# Patient Record
Sex: Female | Born: 1985 | Race: White | Hispanic: No | Marital: Single | Smoking: Light tobacco smoker
Health system: Southern US, Community
[De-identification: ages and names within clinical notes are randomized; demographics above are authoritative.]

## PROBLEM LIST (undated history)

## (undated) ENCOUNTER — Inpatient Hospital Stay (HOSPITAL_COMMUNITY): Payer: Self-pay

## (undated) DIAGNOSIS — R519 Headache, unspecified: Secondary | ICD-10-CM

## (undated) DIAGNOSIS — K623 Rectal prolapse: Secondary | ICD-10-CM

## (undated) DIAGNOSIS — R51 Headache: Secondary | ICD-10-CM

## (undated) DIAGNOSIS — Z8739 Personal history of other diseases of the musculoskeletal system and connective tissue: Secondary | ICD-10-CM

## (undated) HISTORY — PX: CHOLECYSTECTOMY: SHX55

---

## 2005-09-28 ENCOUNTER — Emergency Department (HOSPITAL_COMMUNITY): Admission: EM | Admit: 2005-09-28 | Discharge: 2005-09-28 | Payer: Self-pay | Admitting: Emergency Medicine

## 2014-09-06 ENCOUNTER — Inpatient Hospital Stay (HOSPITAL_COMMUNITY)
Admission: AD | Admit: 2014-09-06 | Discharge: 2014-09-06 | Discharge status: Against Medical Advice | Payer: Medicaid Other | Source: Ambulatory Visit | Attending: Obstetrics and Gynecology | Admitting: Obstetrics and Gynecology

## 2014-09-06 ENCOUNTER — Encounter (HOSPITAL_COMMUNITY): Payer: Self-pay | Admitting: Emergency Medicine

## 2014-09-06 DIAGNOSIS — Z654 Victim of crime and terrorism: Secondary | ICD-10-CM | POA: Diagnosis not present

## 2014-09-06 DIAGNOSIS — R109 Unspecified abdominal pain: Secondary | ICD-10-CM | POA: Insufficient documentation

## 2014-09-06 DIAGNOSIS — F17200 Nicotine dependence, unspecified, uncomplicated: Secondary | ICD-10-CM | POA: Insufficient documentation

## 2014-09-06 DIAGNOSIS — T8339XA Other mechanical complication of intrauterine contraceptive device, initial encounter: Secondary | ICD-10-CM | POA: Insufficient documentation

## 2014-09-06 DIAGNOSIS — Z3202 Encounter for pregnancy test, result negative: Secondary | ICD-10-CM | POA: Insufficient documentation

## 2014-09-06 LAB — URINALYSIS, ROUTINE W REFLEX MICROSCOPIC
Bilirubin Urine: NEGATIVE
Glucose, UA: NEGATIVE mg/dL
KETONES UR: NEGATIVE mg/dL
Leukocytes, UA: NEGATIVE
NITRITE: NEGATIVE
PH: 6 (ref 5.0–8.0)
Protein, ur: NEGATIVE mg/dL
Specific Gravity, Urine: 1.025 (ref 1.005–1.030)
Urobilinogen, UA: 0.2 mg/dL (ref 0.0–1.0)

## 2014-09-06 LAB — URINE MICROSCOPIC-ADD ON

## 2014-09-06 LAB — POCT PREGNANCY, URINE: PREG TEST UR: NEGATIVE

## 2014-09-06 NOTE — MAU Note (Signed)
Mirena placed 3 years ago. Was "cleaning self out" at 5 pm, her finger got caught on her string & pulled on the IUD, has had contraction like pain since then.

## 2014-09-06 NOTE — ED Notes (Signed)
Pt. reports vaginal discomfort/pain due to dislodged IUD with spotting onset today , denies fever or chills.

## 2014-09-06 NOTE — MAU Note (Signed)
Pt requesting info on wait time. Discussed that patients are brought back based on acuity. Pt states she is leaving & going to another emergency room.

## 2014-09-07 ENCOUNTER — Inpatient Hospital Stay (HOSPITAL_COMMUNITY): Payer: Medicaid Other

## 2014-09-07 ENCOUNTER — Emergency Department (HOSPITAL_COMMUNITY): Payer: Medicaid Other

## 2014-09-07 ENCOUNTER — Emergency Department (HOSPITAL_COMMUNITY)
Admission: EM | Admit: 2014-09-07 | Discharge: 2014-09-07 | Disposition: A | Discharge status: Home / Self Care | Payer: Medicaid Other | Attending: Emergency Medicine | Admitting: Emergency Medicine

## 2014-09-07 ENCOUNTER — Inpatient Hospital Stay (EMERGENCY_DEPARTMENT_HOSPITAL)
Admission: AD | Admit: 2014-09-07 | Discharge: 2014-09-07 | Disposition: A | Discharge status: Home / Self Care | Payer: Medicaid Other | Source: Ambulatory Visit | Attending: Obstetrics and Gynecology | Admitting: Obstetrics and Gynecology

## 2014-09-07 DIAGNOSIS — Z30432 Encounter for removal of intrauterine contraceptive device: Secondary | ICD-10-CM

## 2014-09-07 DIAGNOSIS — T8332XA Displacement of intrauterine contraceptive device, initial encounter: Secondary | ICD-10-CM

## 2014-09-07 LAB — PREGNANCY, URINE: Preg Test, Ur: NEGATIVE

## 2014-09-07 MED ORDER — ONDANSETRON 4 MG PO TBDP
4.0000 mg | ORAL_TABLET | Freq: Once | ORAL | Status: AC
  Administered 2014-09-07: 4 mg via ORAL
  Filled 2014-09-07: qty 1

## 2014-09-07 MED ORDER — TRAMADOL HCL 50 MG PO TABS: 50.0000 mg | ORAL_TABLET | Freq: Four times a day (QID) | ORAL | Status: DC | PRN

## 2014-09-07 MED ORDER — KETOROLAC TROMETHAMINE 60 MG/2ML IM SOLN
60.0000 mg | Freq: Once | INTRAMUSCULAR | Status: AC
  Administered 2014-09-07: 60 mg via INTRAMUSCULAR
  Filled 2014-09-07: qty 2

## 2014-09-07 NOTE — ED Notes (Signed)
Patient placed in gown. Pelvic Cart at bedside.

## 2014-09-07 NOTE — ED Notes (Signed)
Tiffany reviewed the patient would like to be seen at [facility] for removal of IUD. Will prepare for dischage

## 2014-09-07 NOTE — ED Provider Notes (Signed)
Medical screening examination/treatment/procedure(s) were performed by non-physician practitioner and as supervising physician I was immediately available for consultation/collaboration.   EKG Interpretation None        Tomasita Crumble, MD 09/07/14 1328

## 2014-09-07 NOTE — MAU Note (Signed)
Pt was seen earlier at [facility] d/t IUD in wrong place. Pt had dislodged the IUD while bathing earlier on in the day 9/16 and had come to [facility] but left AMA d/t long wait.

## 2014-09-07 NOTE — ED Notes (Signed)
Called Tiffany, PA-C to update patient on results of radiology testing, patient is getting anxious about results.  She is at the bedside now.

## 2014-09-07 NOTE — ED Notes (Signed)
Tiffany, PA-C, is at the bedside for pelvic exam.

## 2014-09-07 NOTE — Discharge Instructions (Signed)
Intrauterine Device Information An intrauterine device (IUD) is inserted into your uterus to prevent pregnancy. There are two types of IUDs available:   Copper IUD--This type of IUD is wrapped in copper wire and is placed inside the uterus. Copper makes the uterus and fallopian tubes produce a fluid that kills sperm. The copper IUD can stay in place for 10 years.  Hormone IUD--This type of IUD contains the hormone progestin (synthetic progesterone). The hormone thickens the cervical mucus and prevents sperm from entering the uterus. It also thins the uterine lining to prevent implantation of a fertilized egg. The hormone can weaken or kill the sperm that get into the uterus. One type of hormone IUD can stay in place for 5 years, and another type can stay in place for 3 years. Your health care provider will make sure you are a good candidate for a contraceptive IUD. Discuss with your health care provider the possible side effects.  ADVANTAGES OF AN INTRAUTERINE DEVICE  IUDs are highly effective, reversible, long acting, and low maintenance.   There are no estrogen-related side effects.   An IUD can be used when breastfeeding.   IUDs are not associated with weight gain.   The copper IUD works immediately after insertion.   The hormone IUD works right away if inserted within 7 days of your period starting. You will need to use a backup method of birth control for 7 days if the hormone IUD is inserted at any other time in your cycle.  The copper IUD does not interfere with your female hormones.   The hormone IUD can make heavy menstrual periods lighter and decrease cramping.   The hormone IUD can be used for 3 or 5 years.   The copper IUD can be used for 10 years. DISADVANTAGES OF AN INTRAUTERINE DEVICE  The hormone IUD can be associated with irregular bleeding patterns.   The copper IUD can make your menstrual flow heavier and more painful.   You may experience cramping and  vaginal bleeding after insertion.  Document Released: 11/11/2004 Document Revised: 08/10/2013 Document Reviewed: 05/29/2013 Exit[employer] Patient Information 2015 [employer]. This information is not intended to replace advice given to you by your health care provider. Make sure you discuss any questions you have with your health care provider.  

## 2014-09-07 NOTE — ED Notes (Signed)
X-ray called in regards to pregnancy test, patient received negative pregnancy test from [facility] yesterday, will go forward with ordered X-ray of pelvis.

## 2014-09-07 NOTE — ED Notes (Signed)
Called Tiffany, PA-C as patient requested. She would like to report that she can now "feel the strain".  Tiffany acknolwedges and has pelvis x-ray ordered to view position of IUD.  She will round on patient again post x-ray. Patient acknowledges, and has no other concerns.

## 2014-09-07 NOTE — MAU Provider Note (Signed)
  History     CSN: [id number]  Arrival date and time: 09/07/14 4132   First Provider Initiated Contact with Patient 09/07/14 [phone]      Chief Complaint  Patient presents with  . Contraception   HPI Comments: Victoria Nelson 28 y.o. Presents ro MAU with IUD issues. She had been to [facility] and was sent here. She has had this IUD for 3 years and now it is causing her pain. Same sexual partner     No past medical history on file.  No past surgical history on file.  No family history on file.  History  Substance Use Topics  . Smoking status: Current Every Day Smoker  . Smokeless tobacco: Not on file  . Alcohol Use: Yes    Allergies:  Allergies  Allergen Reactions  . Sulfa Antibiotics Itching    Prescriptions prior to admission  Medication Sig Dispense Refill  . levonorgestrel (MIRENA) 20 MCG/24HR IUD 1 each by Intrauterine route once.      . traMADol (ULTRAM) 50 MG tablet Take 1 tablet (50 mg total) by mouth every 6 (six) hours as needed.  15 tablet  0    Review of Systems  Constitutional: Negative.   HENT: Negative.   Eyes: Negative.   Respiratory: Negative.   Cardiovascular: Negative.   Genitourinary:       Pelvic pain  Skin: Negative.   Neurological: Negative.   Psychiatric/Behavioral: Negative.    Physical Exam   Blood pressure 110/89, pulse 67, temperature 98 F (36.7 C), temperature source Oral, resp. rate 18.  Physical Exam  Constitutional: She is oriented to person, place, and time. She appears well-developed and well-nourished. No distress.  HENT:  Head: Normocephalic and atraumatic.  Eyes: Pupils are equal, round, and reactive to light.  Neck: Normal range of motion.  Genitourinary:  Genital:external negative Vaginal:small amount vaginal discharge Cervix:thick/ closed/ IUD strings seen IUD pulled without any difficulty Bimanual: slight tenderness   Musculoskeletal: Normal range of motion.  Neurological: She is alert and oriented to person,  place, and time.  Skin: Skin is warm and dry.  Psychiatric: She has a normal mood and affect. Her behavior is normal. Judgment and thought content normal.   Results for orders placed during the hospital encounter of 09/07/14 (from the past 24 hour(s))  PREGNANCY, URINE     Status: None   Collection Time    09/07/14  3:14 AM      Result Value Ref Range   Preg Test, Ur NEGATIVE  NEGATIVE    MAU Course  Procedures  MDM   Assessment and Plan  Contraception Management/ she plan to replace IUD  P: IUD removal Advised to return to GYN and use condoms until she has birth control  Carolynn Serve 09/07/2014, 7:02 AM

## 2014-09-07 NOTE — ED Provider Notes (Signed)
CSN: [id number]     Arrival date & time 09/06/14  2136 History   First MD Initiated Contact with Patient 09/07/14 0142     Chief Complaint  Patient presents with  . Abdominal Pain     (Consider location/radiation/quality/duration/timing/severity/associated sxs/prior Treatment) HPI  Patient to the ER with complaints of vaginal discomfort after "vigorously cleaning" and accidentally tugging on her IUD string causing it to move earlier today.  She is now having spotting. She denies vaginal discharge, abdominal pain, nausea, vomiting, diarrhea.   History reviewed. No pertinent past medical history. History reviewed. No pertinent past surgical history. No family history on file. History  Substance Use Topics  . Smoking status: Current Every Day Smoker  . Smokeless tobacco: Not on file  . Alcohol Use: Yes   OB History   Grav Para Term Preterm Abortions TAB SAB Ect Mult Living                 Review of Systems   Review of Systems  Gen: no weight loss, fevers, chills, night sweats  Eyes: no occular draining, occular pain,  No visual changes  Nose: no epistaxis or rhinorrhea  Mouth: no dental pain, no sore throat  Neck: no neck pain  Lungs: No hemoptysis. No wheezing or coughing CV:  No palpitations, dependent edema or orthopnea. No chest pain Abd: no diarrhea. No nausea or vomiting, No abdominal pain  GU: no dysuria or gross hematuria  + vaginal pain MSK:  No muscle weakness, No muscular pain Neuro: no headache, no focal neurologic deficits  Skin: no rash , no wounds Psyche: no complaints of depression or anxiety    Allergies  Sulfa antibiotics  Home Medications   Prior to Admission medications   Medication Sig Start Date End Date Taking? Authorizing Provider  levonorgestrel (MIRENA) 20 MCG/24HR IUD 1 each by Intrauterine route once.   Yes Historical Provider, MD  traMADol (ULTRAM) 50 MG tablet Take 1 tablet (50 mg total) by mouth every 6 (six) hours as needed. 09/07/14    Shadrack Brummitt Irine Seal, PA-C   BP 100/66  Pulse 72  Temp(Src) 98.4 F (36.9 C) (Oral)  Resp 14  Ht  (1.6 m)  Wt 217 lb (98.431 kg)  BMI 38.45 kg/m2  SpO2 99% Physical Exam  Nursing note and vitals reviewed. Constitutional: She appears well-developed and well-nourished. No distress.  HENT:  Head: Normocephalic and atraumatic.  Eyes: Pupils are equal, round, and reactive to light.  Neck: Normal range of motion. Neck supple.  Cardiovascular: Normal rate and regular rhythm.   Pulmonary/Chest: Effort normal.  Abdominal: Soft.  Genitourinary:  Unable to visualize string. No IUD visualized  Neurological: She is alert.  Skin: Skin is warm and dry.    ED Course  Procedures (including critical care time) Labs Review Labs Reviewed  PREGNANCY, URINE    Imaging Review Dg Pelvis 1-2 Views  09/07/2014   CLINICAL DATA:  Abdominal pain.  EXAM: PELVIS - 1-2 VIEW  COMPARISON:  None.  FINDINGS: There is no evidence of fracture or dislocation. Both femoral heads are seated normally within their respective acetabula. No significant degenerative change is appreciated. The sacroiliac joints are unremarkable in appearance.  The visualized bowel gas pattern is grossly unremarkable in appearance. An intrauterine device is noted overlying the mid pelvis.  IMPRESSION: No evidence of fracture or dislocation. The position of the intrauterine device is not well assessed on radiograph, though it is seen overlying the mid pelvis.  If there is  significant clinical concern for abnormal positioning of the intrauterine device, pelvic ultrasound could be considered for further evaluation.   Electronically Signed   By: Roanna Raider M.D.   On: 09/07/2014 04:16     EKG Interpretation None      MDM   Final diagnoses:  Malpositioned IUD, initial encounter    I attempted pelvic exam but was unable to visualize string. I called over to [facility] and they reported patient could go over there to be seen and  they currently do not have a long wait. I spoke with Dr.Oni, who does not recommend I remove the IUD. Xray does not show any signs of perforation. Patients pain well managed with IM medications. Rx Tramadol.  28 y.o.Tessa Esson's evaluation in the Emergency Department is complete. It has been determined that no acute conditions requiring further emergency intervention are present at this time. The patient/guardian have been advised of the diagnosis and plan. We have discussed signs and symptoms that warrant return to the ED, such as changes or worsening in symptoms.  Vital signs are stable at discharge. Filed Vitals:   09/07/14 0430  BP: 100/66  Pulse: 72  Temp:   Resp:     Patient/guardian has voiced understanding and agreed to follow-up with the PCP or specialist.     Dorthula Matas, PA-C 09/07/14 [phone]

## 2014-09-12 NOTE — MAU Provider Note (Signed)
Pap done, will follow up results and manage accordingly. Mammogram scheduled Routine preventative health maintenance measures emphasized   UGONNA  ANYANWU, MD, FACOG Attending Obstetrician & Gynecologist Faculty Practice, [facility]  

## 2015-09-19 ENCOUNTER — Emergency Department (HOSPITAL_COMMUNITY): Payer: No Typology Code available for payment source

## 2015-09-19 ENCOUNTER — Emergency Department (HOSPITAL_COMMUNITY)
Admission: EM | Admit: 2015-09-19 | Discharge: 2015-09-20 | Disposition: A | Discharge status: Home / Self Care | Payer: No Typology Code available for payment source | Attending: Emergency Medicine | Admitting: Emergency Medicine

## 2015-09-19 ENCOUNTER — Encounter (HOSPITAL_COMMUNITY): Payer: Self-pay

## 2015-09-19 DIAGNOSIS — Y9389 Activity, other specified: Secondary | ICD-10-CM | POA: Diagnosis not present

## 2015-09-19 DIAGNOSIS — Z79899 Other long term (current) drug therapy: Secondary | ICD-10-CM | POA: Insufficient documentation

## 2015-09-19 DIAGNOSIS — Y92410 Unspecified street and highway as the place of occurrence of the external cause: Secondary | ICD-10-CM | POA: Insufficient documentation

## 2015-09-19 DIAGNOSIS — Z720 Tobacco use: Secondary | ICD-10-CM | POA: Diagnosis not present

## 2015-09-19 DIAGNOSIS — Y998 Other external cause status: Secondary | ICD-10-CM | POA: Diagnosis not present

## 2015-09-19 DIAGNOSIS — S0990XA Unspecified injury of head, initial encounter: Secondary | ICD-10-CM | POA: Insufficient documentation

## 2015-09-19 DIAGNOSIS — S3992XA Unspecified injury of lower back, initial encounter: Secondary | ICD-10-CM | POA: Diagnosis not present

## 2015-09-19 DIAGNOSIS — S161XXA Strain of muscle, fascia and tendon at neck level, initial encounter: Secondary | ICD-10-CM | POA: Insufficient documentation

## 2015-09-19 DIAGNOSIS — S199XXA Unspecified injury of neck, initial encounter: Secondary | ICD-10-CM | POA: Diagnosis present

## 2015-09-19 DIAGNOSIS — G43009 Migraine without aura, not intractable, without status migrainosus: Secondary | ICD-10-CM | POA: Diagnosis not present

## 2015-09-19 MED ORDER — CYCLOBENZAPRINE HCL 10 MG PO TABS: 10.0000 mg | ORAL_TABLET | Freq: Two times a day (BID) | ORAL | Status: DC | PRN

## 2015-09-19 MED ORDER — SUMATRIPTAN SUCCINATE 6 MG/0.5ML ~~LOC~~ SOLN
6.0000 mg | Freq: Once | SUBCUTANEOUS | Status: AC
  Administered 2015-09-19: 6 mg via SUBCUTANEOUS
  Filled 2015-09-19: qty 0.5

## 2015-09-19 MED ORDER — IBUPROFEN 800 MG PO TABS: 800.0000 mg | ORAL_TABLET | Freq: Three times a day (TID) | ORAL | Status: DC
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
## 2015-09-19 NOTE — ED Notes (Signed)
Pt takes maxalt for her migraines and wants one

## 2015-09-19 NOTE — Discharge Instructions (Signed)
Cervical Sprain °A cervical sprain is an injury in the neck in which the strong, fibrous tissues (ligaments) that connect your neck bones stretch or tear. Cervical sprains can range from mild to severe. Severe cervical sprains can cause the neck vertebrae to be unstable. This can lead to damage of the spinal cord and can result in serious nervous system problems. The amount of time it takes for a cervical sprain to get better depends on the cause and extent of the injury. Most cervical sprains heal in 1 to 3 weeks. °CAUSES  °Severe cervical sprains may be caused by:  °· Contact sport injuries (such as from football, rugby, wrestling, hockey, auto racing, gymnastics, diving, martial arts, or boxing).   °· Motor vehicle collisions.   °· Whiplash injuries. This is an injury from a sudden forward and backward whipping movement of the head and neck.  °· Falls.   °Mild cervical sprains may be caused by:  °· Being in an awkward position, such as while cradling a telephone between your ear and shoulder.   °· Sitting in a chair that does not offer proper support.   °· Working at a poorly designed computer station.   °· Looking up or down for long periods of time.   °SYMPTOMS  °· Pain, soreness, stiffness, or a burning sensation in the front, back, or sides of the neck. This discomfort may develop immediately after the injury or slowly, 24 hours or more after the injury.   °· Pain or tenderness directly in the middle of the back of the neck.   °· Shoulder or upper back pain.   °· Limited ability to move the neck.   °· Headache.   °· Dizziness.   °· Weakness, numbness, or tingling in the hands or arms.   °· Muscle spasms.   °· Difficulty swallowing or chewing.   °· Tenderness and swelling of the neck.   °DIAGNOSIS  °Most of the time your health care provider can diagnose a cervical sprain by taking your history and doing a physical exam. Your health care provider will ask about previous neck injuries and any known neck  problems, such as arthritis in the neck. X-rays may be taken to find out if there are any other problems, such as with the bones of the neck. Other tests, such as a CT scan or MRI, may also be needed.  °TREATMENT  °Treatment depends on the severity of the cervical sprain. Mild sprains can be treated with rest, keeping the neck in place (immobilization), and pain medicines. Severe cervical sprains are immediately immobilized. Further treatment is done to help with pain, muscle spasms, and other symptoms and may include: °· Medicines, such as pain relievers, numbing medicines, or muscle relaxants.   °· Physical therapy. This may involve stretching exercises, strengthening exercises, and posture training. Exercises and improved posture can help stabilize the neck, strengthen muscles, and help stop symptoms from returning.   °HOME CARE INSTRUCTIONS  °· Put ice on the injured area.   °· Put ice in a plastic bag.   °· Place a towel between your skin and the bag.   °· Leave the ice on for 15-20 minutes, 3-4 times a day.   °· If your injury was severe, you may have been given a cervical collar to wear. A cervical collar is a two-piece collar designed to keep your neck from moving while it heals. °· Do not remove the collar unless instructed by your health care provider. °· If you have long hair, keep it outside of the collar. °· Ask your health care provider before making any adjustments to your collar. Minor   adjustments may be required over time to improve comfort and reduce pressure on your chin or on the back of your head. °· If you are allowed to remove the collar for cleaning or bathing, follow your health care provider's instructions on how to do so safely. °· Keep your collar clean by wiping it with mild soap and water and drying it completely. If the collar you have been given includes removable pads, remove them every 1-2 days and hand wash them with soap and water. Allow them to air dry. They should be completely  dry before you wear them in the collar. °· If you are allowed to remove the collar for cleaning and bathing, wash and dry the skin of your neck. Check your skin for irritation or sores. If you see any, tell your health care provider. °· Do not drive while wearing the collar.   °· Only take over-the-counter or prescription medicines for pain, discomfort, or fever as directed by your health care provider.   °· Keep all follow-up appointments as directed by your health care provider.   °· Keep all physical therapy appointments as directed by your health care provider.   °· Make any needed adjustments to your workstation to promote good posture.   °· Avoid positions and activities that make your symptoms worse.   °· Warm up and stretch before being active to help prevent problems.   °SEEK MEDICAL CARE IF:  °· Your pain is not controlled with medicine.   °· You are unable to decrease your pain medicine over time as planned.   °· Your activity level is not improving as expected.   °SEEK IMMEDIATE MEDICAL CARE IF:  °· You develop any bleeding. °· You develop stomach upset. °· You have signs of an allergic reaction to your medicine.   °· Your symptoms get worse.   °· You develop new, unexplained symptoms.   °· You have numbness, tingling, weakness, or paralysis in any part of your body.   °MAKE SURE YOU:  °· Understand these instructions. °· Will watch your condition. °· Will get help right away if you are not doing well or get worse. °Document Released: 10/05/2007 Document Revised: 12/13/2013 Document Reviewed: 06/15/2013 °Exit[employer]® Patient Information ©2015 [employer]. This information is not intended to replace advice given to you by your health care provider. Make sure you discuss any questions you have with your health care provider. ° °Cryotherapy °Cryotherapy means treatment with cold. Ice or gel packs can be used to reduce both pain and swelling. Ice is the most helpful within the first 24 to 48 hours after an  injury or flare-up from overusing a muscle or joint. Sprains, strains, spasms, burning pain, shooting pain, and aches can all be eased with ice. Ice can also be used when recovering from surgery. Ice is effective, has very few side effects, and is safe for most people to use. °PRECAUTIONS  °Ice is not a safe treatment option for people with: °· Raynaud phenomenon. This is a condition affecting small blood vessels in the extremities. Exposure to cold may cause your problems to return. °· Cold hypersensitivity. There are many forms of cold hypersensitivity, including: °¨ Cold urticaria. Red, itchy hives appear on the skin when the tissues begin to warm after being iced. °¨ Cold erythema. This is a red, itchy rash caused by exposure to cold. °¨ Cold hemoglobinuria. Red blood cells break down when the tissues begin to warm after being iced. The hemoglobin that carry oxygen are passed into the urine because they cannot combine with blood proteins fast enough. °·   Numbness or altered sensitivity in the area being iced. °If you have any of the following conditions, do not use ice until you have discussed cryotherapy with your caregiver: °· Heart conditions, such as arrhythmia, angina, or chronic heart disease. °· High blood pressure. °· Healing wounds or open skin in the area being iced. °· Current infections. °· Rheumatoid arthritis. °· Poor circulation. °· Diabetes. °Ice slows the blood flow in the region it is applied. This is beneficial when trying to stop inflamed tissues from spreading irritating chemicals to surrounding tissues. However, if you expose your skin to cold temperatures for too long or without the proper protection, you can damage your skin or nerves. Watch for signs of skin damage due to cold. °HOME CARE INSTRUCTIONS °Follow these tips to use ice and cold packs safely. °· Place a dry or damp towel between the ice and skin. A damp towel will cool the skin more quickly, so you may need to shorten the time  that the ice is used. °· For a more rapid response, add gentle compression to the ice. °· Ice for no more than 10 to 20 minutes at a time. The bonier the area you are icing, the less time it will take to get the benefits of ice. °· Check your skin after 5 minutes to make sure there are no signs of a poor response to cold or skin damage. °· Rest 20 minutes or more between uses. °· Once your skin is numb, you can end your treatment. You can test numbness by very lightly touching your skin. The touch should be so light that you do not see the skin dimple from the pressure of your fingertip. When using ice, most people will feel these normal sensations in this order: cold, burning, aching, and numbness. °· Do not use ice on someone who cannot communicate their responses to pain, such as small children or people with dementia. °HOW TO MAKE AN ICE PACK °Ice packs are the most common way to use ice therapy. Other methods include ice massage, ice baths, and cryosprays. Muscle creams that cause a cold, tingly feeling do not offer the same benefits that ice offers and should not be used as a substitute unless recommended by your caregiver. °To make an ice pack, do one of the following: °· Place crushed ice or a bag of frozen vegetables in a sealable plastic bag. Squeeze out the excess air. Place this bag inside another plastic bag. Slide the bag into a pillowcase or place a damp towel between your skin and the bag. °· Mix 3 parts water with 1 part rubbing alcohol. Freeze the mixture in a sealable plastic bag. When you remove the mixture from the freezer, it will be slushy. Squeeze out the excess air. Place this bag inside another plastic bag. Slide the bag into a pillowcase or place a damp towel between your skin and the bag. °SEEK MEDICAL CARE IF: °· You develop white spots on your skin. This may give the skin a blotchy (mottled) appearance. °· Your skin turns blue or pale. °· Your skin becomes waxy or hard. °· Your swelling  gets worse. °MAKE SURE YOU:  °· Understand these instructions. °· Will watch your condition. °· Will get help right away if you are not doing well or get worse. °Document Released: 08/04/2011 Document Revised: 04/24/2014 Document Reviewed: 08/04/2011 °ExitCare® Patient Information ©2015 [employer]. This information is not intended to replace advice given to you by your health care provider. Make   sure you discuss any questions you have with your health care provider. °Motor Vehicle Collision °It is common to have multiple bruises and sore muscles after a motor vehicle collision (MVC). These tend to feel worse for the first 24 hours. You may have the most stiffness and soreness over the first several hours. You may also feel worse when you wake up the first morning after your collision. After this point, you will usually begin to improve with each day. The speed of improvement often depends on the severity of the collision, the number of injuries, and the location and nature of these injuries. °HOME CARE INSTRUCTIONS °· Put ice on the injured area. °¨ Put ice in a plastic bag. °¨ Place a towel between your skin and the bag. °¨ Leave the ice on for 15-20 minutes, 3-4 times a day, or as directed by your health care provider. °· Drink enough fluids to keep your urine clear or pale yellow. Do not drink alcohol. °· Take a warm shower or bath once or twice a day. This will increase blood flow to sore muscles. °· You may return to activities as directed by your caregiver. Be careful when lifting, as this may aggravate neck or back pain. °· Only take over-the-counter or prescription medicines for pain, discomfort, or fever as directed by your caregiver. Do not use aspirin. This may increase bruising and bleeding. °SEEK IMMEDIATE MEDICAL CARE IF: °· You have numbness, tingling, or weakness in the arms or legs. °· You develop severe headaches not relieved with medicine. °· You have severe neck pain, especially tenderness in  the middle of the back of your neck. °· You have changes in bowel or bladder control. °· There is increasing pain in any area of the body. °· You have shortness of breath, light-headedness, dizziness, or fainting. °· You have chest pain. °· You feel sick to your stomach (nauseous), throw up (vomit), or sweat. °· You have increasing abdominal discomfort. °· There is blood in your urine, stool, or vomit. °· You have pain in your shoulder (shoulder strap areas). °· You feel your symptoms are getting worse. °MAKE SURE YOU: °· Understand these instructions. °· Will watch your condition. °· Will get help right away if you are not doing well or get worse. °Document Released: 12/08/2005 Document Revised: 04/24/2014 Document Reviewed: 05/07/2011 °Exit[employer]® Patient Information ©2015 [employer]. This information is not intended to replace advice given to you by your health care provider. Make sure you discuss any questions you have with your health care provider. ° °
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
## 2015-11-01 ENCOUNTER — Emergency Department (HOSPITAL_COMMUNITY)
Admission: EM | Admit: 2015-11-01 | Discharge: 2015-11-01 | Disposition: A | Discharge status: Home / Self Care | Payer: Medicaid Other | Attending: Emergency Medicine | Admitting: Emergency Medicine

## 2015-11-01 ENCOUNTER — Encounter (HOSPITAL_COMMUNITY): Payer: Self-pay | Admitting: Family Medicine

## 2015-11-01 ENCOUNTER — Emergency Department (HOSPITAL_COMMUNITY): Payer: Medicaid Other

## 2015-11-01 DIAGNOSIS — B349 Viral infection, unspecified: Secondary | ICD-10-CM | POA: Diagnosis not present

## 2015-11-01 DIAGNOSIS — H8110 Benign paroxysmal vertigo, unspecified ear: Secondary | ICD-10-CM | POA: Diagnosis not present

## 2015-11-01 DIAGNOSIS — Z720 Tobacco use: Secondary | ICD-10-CM | POA: Insufficient documentation

## 2015-11-01 DIAGNOSIS — Z3202 Encounter for pregnancy test, result negative: Secondary | ICD-10-CM | POA: Diagnosis not present

## 2015-11-01 DIAGNOSIS — R42 Dizziness and giddiness: Secondary | ICD-10-CM | POA: Diagnosis present

## 2015-11-01 LAB — CBC WITH DIFFERENTIAL/PLATELET
BASOS PCT: 0 %
Basophils Absolute: 0 10*3/uL (ref 0.0–0.1)
EOS ABS: 0 10*3/uL (ref 0.0–0.7)
EOS PCT: 0 %
HCT: 41.7 % (ref 36.0–46.0)
Hemoglobin: 14.2 g/dL (ref 12.0–15.0)
Lymphocytes Relative: 8 %
Lymphs Abs: 0.5 10*3/uL — ABNORMAL LOW (ref 0.7–4.0)
MCH: 30.7 pg (ref 26.0–34.0)
MCHC: 34.1 g/dL (ref 30.0–36.0)
MCV: 90.1 fL (ref 78.0–100.0)
Monocytes Absolute: 0.3 10*3/uL (ref 0.1–1.0)
Monocytes Relative: 5 %
Neutro Abs: 5.4 10*3/uL (ref 1.7–7.7)
Neutrophils Relative %: 87 %
Platelets: 216 10*3/uL (ref 150–400)
RBC: 4.63 MIL/uL (ref 3.87–5.11)
RDW: 12.3 % (ref 11.5–15.5)
WBC: 6.3 10*3/uL (ref 4.0–10.5)

## 2015-11-01 LAB — COMPREHENSIVE METABOLIC PANEL
ALBUMIN: 4.6 g/dL (ref 3.5–5.0)
ALT: 22 U/L (ref 14–54)
AST: 26 U/L (ref 15–41)
Alkaline Phosphatase: 72 U/L (ref 38–126)
Anion gap: 8 (ref 5–15)
BUN: 11 mg/dL (ref 6–20)
CHLORIDE: 103 mmol/L (ref 101–111)
CO2: 25 mmol/L (ref 22–32)
Calcium: 9.4 mg/dL (ref 8.9–10.3)
Creatinine, Ser: 0.57 mg/dL (ref 0.44–1.00)
GFR calc non Af Amer: >60 mL/min (ref >60)
Glucose, Bld: 105 mg/dL — ABNORMAL HIGH (ref 65–99)
Potassium: 3.6 mmol/L (ref 3.5–5.1)
SODIUM: 136 mmol/L (ref 135–145)
Total Bilirubin: 1 mg/dL (ref 0.3–1.2)
Total Protein: 7.8 g/dL (ref 6.5–8.1)

## 2015-11-01 LAB — URINALYSIS, ROUTINE W REFLEX MICROSCOPIC
Bilirubin Urine: NEGATIVE
Glucose, UA: NEGATIVE mg/dL
Hgb urine dipstick: NEGATIVE
Ketones, ur: NEGATIVE mg/dL
Nitrite: NEGATIVE
PROTEIN: NEGATIVE mg/dL
Specific Gravity, Urine: 1.016 (ref 1.005–1.030)
UROBILINOGEN UA: 1 mg/dL (ref 0.0–1.0)
pH: 7.5 (ref 5.0–8.0)

## 2015-11-01 LAB — URINE MICROSCOPIC-ADD ON

## 2015-11-01 LAB — LIPASE, BLOOD: Lipase: 22 U/L (ref 11–51)

## 2015-11-01 LAB — I-STAT BETA HCG BLOOD, ED (MC, WL, AP ONLY)

## 2015-11-01 LAB — I-STAT TROPONIN, ED: TROPONIN I, POC: 0 ng/mL (ref 0.00–0.08)

## 2015-11-01 MED ORDER — DIPHENHYDRAMINE HCL 50 MG/ML IJ SOLN
25.0000 mg | Freq: Once | INTRAMUSCULAR | Status: AC
  Administered 2015-11-01: 25 mg via INTRAVENOUS
  Filled 2015-11-01: qty 1

## 2015-11-01 MED ORDER — MECLIZINE HCL 25 MG PO TABS: 25.0000 mg | ORAL_TABLET | Freq: Three times a day (TID) | ORAL | Status: DC | PRN

## 2015-11-01 MED ORDER — SODIUM CHLORIDE 0.9 % IV BOLUS (SEPSIS)
1000.0000 mL | Freq: Once | INTRAVENOUS | Status: AC
  Administered 2015-11-01: 1000 mL via INTRAVENOUS

## 2015-11-01 MED ORDER — METOCLOPRAMIDE HCL 5 MG/ML IJ SOLN
10.0000 mg | Freq: Once | INTRAMUSCULAR | Status: AC
  Administered 2015-11-01: 10 mg via INTRAVENOUS
  Filled 2015-11-01: qty 2

## 2015-11-01 MED ORDER — ACETAMINOPHEN 500 MG PO TABS
1000.0000 mg | ORAL_TABLET | Freq: Once | ORAL | Status: AC
  Administered 2015-11-01: 1000 mg via ORAL
  Filled 2015-11-01: qty 2

## 2015-11-01 MED ORDER — MECLIZINE HCL 25 MG PO TABS
25.0000 mg | ORAL_TABLET | Freq: Once | ORAL | Status: AC
  Administered 2015-11-01: 25 mg via ORAL
  Filled 2015-11-01: qty 1

## 2015-11-01 NOTE — Discharge Instructions (Signed)
Take tylenol, motrin for headaches or fevers or myalgias.   Take meclizine for dizziness.   Follow up with your doctor.   Return to ER if you have worse dizziness, fever, vomiting, unable to walk.     Epley Maneuver Self-Care WHAT IS THE EPLEY MANEUVER? The Epley maneuver is an exercise you can do to relieve symptoms of benign paroxysmal positional vertigo (BPPV). This condition is often just referred to as vertigo. BPPV is caused by the movement of tiny crystals (canaliths) inside your inner ear. The accumulation and movement of canaliths in your inner ear causes a sudden spinning sensation (vertigo) when you move your head to certain positions. Vertigo usually lasts about 30 seconds. BPPV usually occurs in just one ear. If you get vertigo when you lie on your left side, you probably have BPPV in your left ear. Your health care provider can tell you which ear is involved.  BPPV may be caused by a head injury. Many people older than 50 get BPPV for unknown reasons. If you have been diagnosed with BPPV, your health care provider may teach you how to do this maneuver. BPPV is not life threatening (benign) and usually goes away in time.  WHEN SHOULD I PERFORM THE EPLEY MANEUVER? You can do this maneuver at home whenever you have symptoms of vertigo. You may do the Epley maneuver up to 3 times a day until your symptoms of vertigo go away. HOW SHOULD I DO THE EPLEY MANEUVER?  Sit on the edge of a bed or table with your back straight. Your legs should be extended or hanging over the edge of the bed or table.   Turn your head halfway toward the affected ear.   Lie backward quickly with your head turned until you are lying flat on your back. You may want to position a pillow under your shoulders.   Hold this position for 30 seconds. You may experience an attack of vertigo. This is normal. Hold this position until the vertigo stops.  Then turn your head to the opposite direction until your  unaffected ear is facing the floor.   Hold this position for 30 seconds. You may experience an attack of vertigo. This is normal. Hold this position until the vertigo stops.  Now turn your whole body to the same side as your head. Hold for another 30 seconds.   You can then sit back up. ARE THERE RISKS TO THIS MANEUVER? In some cases, you may have other symptoms (such as changes in your vision, weakness, or numbness). If you have these symptoms, stop doing the maneuver and call your health care provider. Even if doing these maneuvers relieves your vertigo, you may still have dizziness. Dizziness is the sensation of light-headedness but without the sensation of movement. Even though the Epley maneuver may relieve your vertigo, it is possible that your symptoms will return within 5 years. WHAT SHOULD I DO AFTER THIS MANEUVER? After doing the Epley maneuver, you can return to your normal activities. Ask your doctor if there is anything you should do at home to prevent vertigo. This may include:  Sleeping with two or more pillows to keep your head elevated.  Not sleeping on the side of your affected ear.  Getting up slowly from bed.  Avoiding sudden movements during the day.  Avoiding extreme head movement, like looking up or bending over.  Wearing a cervical collar to prevent sudden head movements. WHAT SHOULD I DO IF MY SYMPTOMS GET WORSE? Call  your health care provider if your vertigo gets worse. Call your provider right way if you have other symptoms, including:   Nausea.  Vomiting.  Headache.  Weakness.  Numbness.  Vision changes.   This information is not intended to replace advice given to you by your health care provider. Make sure you discuss any questions you have with your health care provider.   Document Released: 12/13/2013 Document Reviewed: 12/13/2013 Elsevier Interactive Patient Education 2016 [employer]   Benign Positional Vertigo Vertigo is the feeling  that you or your surroundings are moving when they are not. Benign positional vertigo is the most common form of vertigo. The cause of this condition is not serious (is benign). This condition is triggered by certain movements and positions (is positional). This condition can be dangerous if it occurs while you are doing something that could endanger you or others, such as driving.  CAUSES In many cases, the cause of this condition is not known. It may be caused by a disturbance in an area of the inner ear that helps your brain to sense movement and balance. This disturbance can be caused by a viral infection (labyrinthitis), head injury, or repetitive motion. RISK FACTORS This condition is more likely to develop in:  Women.  People who are 54 years of age or older. SYMPTOMS Symptoms of this condition usually happen when you move your head or your eyes in different directions. Symptoms may start suddenly, and they usually last for less than a minute. Symptoms may include:  Loss of balance and falling.  Feeling like you are spinning or moving.  Feeling like your surroundings are spinning or moving.  Nausea and vomiting.  Blurred vision.  Dizziness.  Involuntary eye movement (nystagmus). Symptoms can be mild and cause only slight annoyance, or they can be severe and interfere with daily life. Episodes of benign positional vertigo may return (recur) over time, and they may be triggered by certain movements. Symptoms may improve over time. DIAGNOSIS This condition is usually diagnosed by medical history and a physical exam of the head, neck, and ears. You may be referred to a health care provider who specializes in ear, nose, and throat (ENT) problems (otolaryngologist) or a provider who specializes in disorders of the nervous system (neurologist). You may have additional testing, including:  MRI.  A CT scan.  Eye movement tests. Your health care provider may ask you to change positions  quickly while he or she watches you for symptoms of benign positional vertigo, such as nystagmus. Eye movement may be tested with an electronystagmogram (ENG), caloric stimulation, the Dix-Hallpike test, or the roll test.  An electroencephalogram (EEG). This records electrical activity in your brain.  Hearing tests. TREATMENT Usually, your health care provider will treat this by moving your head in specific positions to adjust your inner ear back to normal. Surgery may be needed in severe cases, but this is rare. In some cases, benign positional vertigo may resolve on its own in 2-4 weeks. HOME CARE INSTRUCTIONS Safety  Move slowly.Avoid sudden body or head movements.  Avoid driving.  Avoid operating heavy machinery.  Avoid doing any tasks that would be dangerous to you or others if a vertigo episode would occur.  If you have trouble walking or keeping your balance, try using a cane for stability. If you feel dizzy or unstable, sit down right away.  Return to your normal activities as told by your health care provider. Ask your health care provider what activities are  safe for you. General Instructions  Take over-the-counter and prescription medicines only as told by your health care provider.  Avoid certain positions or movements as told by your health care provider.  Drink enough fluid to keep your urine clear or pale yellow.  Keep all follow-up visits as told by your health care provider. This is important. SEEK MEDICAL CARE IF:  You have a fever.  Your condition gets worse or you develop new symptoms.  Your family or friends notice any behavioral changes.  Your nausea or vomiting gets worse.  You have numbness or a "pins and needles" sensation. SEEK IMMEDIATE MEDICAL CARE IF:  You have difficulty speaking or moving.  You are always dizzy.  You faint.  You develop severe headaches.  You have weakness in your legs or arms.  You have changes in your hearing or  vision.  You develop a stiff neck.  You develop sensitivity to light.   This information is not intended to replace advice given to you by your health care provider. Make sure you discuss any questions you have with your health care provider.   Document Released: 09/15/2006 Document Revised: 08/29/2015 Document Reviewed: 04/02/2015 Elsevier Interactive Patient Education [employer]2016 [employer]

## 2015-11-01 NOTE — ED Provider Notes (Signed)
CSN: [id number]     Arrival date & time 11/01/15  [id number] History   First MD Initiated Contact with Patient 11/01/15 [phone]     Chief Complaint  Patient presents with  . Dizziness  . Emesis     (Consider location/radiation/quality/duration/timing/severity/associated sxs/prior Treatment) The history is provided by the patient.  Victoria Nelson is a 29 y.o. female who presented with dizziness, low-grade temp, chills, vomiting. Dizziness started about 2 weeks ago. States that the room feels like it was spinning. She saw her doctor 4 days ago and was noted to have some effusion behind both ears with no signs of otitis media and was diagnosed with possible labyrinthitis and was prescribed Flonase but did not fill the prescription. The dizziness did improve until yesterday. States that she had low-grade temp and chills since yesterday. And her dizziness got worse and she has more ringing in her years. She had one episode vomiting and several episodes diarrhea today. Denies any urinary symptoms and denies any sick contacts.    History reviewed. No pertinent past medical history. Past Surgical History  Procedure Laterality Date  . Cholecystectomy     History reviewed. No pertinent family history. Social History  Substance Use Topics  . Smoking status: Current Some Day Smoker  . Smokeless tobacco: None  . Alcohol Use: Yes     Comment: Once every 6 months.    OB History    No data available     Review of Systems  Gastrointestinal: Positive for vomiting.  Neurological: Positive for dizziness.  All other systems reviewed and are negative.     Allergies  Sulfa antibiotics  Home Medications   Prior to Admission medications   Medication Sig Start Date End Date Taking? Authorizing Provider  Phenyleph-Doxylamine-DM-APAP (NYQUIL SEVERE COLD/FLU) 5-6.25-10-325 MG/15ML LIQD Take 60 mLs by mouth at bedtime as needed (for sleep/cold).    Yes Historical Provider, MD  rizatriptan (MAXALT-MLT) 10  MG disintegrating tablet Take 1 tablet by mouth every 2 (two) hours as needed for migraine (may repeat in 2 hours if needed).  03/19/15  Yes Historical Provider, MD  valACYclovir (VALTREX) 1000 MG tablet Take 1 tablet by mouth daily as needed (for cold sores).  09/15/14  Yes Historical Provider, MD  cyclobenzaprine (FLEXERIL) 10 MG tablet Take 1 tablet (10 mg total) by mouth 2 (two) times daily as needed for muscle spasms. Patient not taking: Reported on 11/01/2015 09/19/15   Elpidio AnisShari Upstill, PA-C  ibuprofen (ADVIL,MOTRIN) 800 MG tablet Take 1 tablet (800 mg total) by mouth 3 (three) times daily. Patient not taking: Reported on 11/01/2015 09/19/15   Elpidio AnisShari Upstill, PA-C   BP 98/65 mmHg  Pulse 75  Temp(Src) 99.3 F (37.4 C) (Oral)  Resp 18  Ht 5\' 3"  (1.6 m)  Wt 215 lb (97.523 kg)  BMI 38.09 kg/m2  SpO2 99%  LMP 10/15/2015 Physical Exam  Constitutional: She is oriented to person, place, and time.  Uncomfortable   HENT:  Head: Normocephalic.  Mouth/Throat: Oropharynx is clear and moist.  Fluid behind bilateral TMs, no erythema, good landmarks   Eyes: Conjunctivae are normal. Pupils are equal, round, and reactive to light.  No nystagmus.   Neck: Normal range of motion. Neck supple.  Cardiovascular: Normal rate, regular rhythm and normal heart sounds.   Pulmonary/Chest: Effort normal and breath sounds normal. No respiratory distress. She has no wheezes. She has no rales.  Abdominal: Soft. Bowel sounds are normal. She exhibits no distension. There is no tenderness. There is no  rebound.  Musculoskeletal: Normal range of motion. She exhibits no edema or tenderness.  Neurological: She is alert and oriented to person, place, and time.  CN 2-12 intact. Nl strength throughout. Nl sensation throughout. Nl finger to nose. Nl gait   Skin: Skin is warm and dry.  Psychiatric: She has a normal mood and affect. Her behavior is normal. Judgment and thought content normal.  Nursing note and vitals  reviewed.   ED Course  Procedures (including critical care time) Labs Review Labs Reviewed  CBC WITH DIFFERENTIAL/PLATELET - Abnormal; Notable for the following:    Lymphs Abs 0.5 (*)    All other components within normal limits  COMPREHENSIVE METABOLIC PANEL - Abnormal; Notable for the following:    Glucose, Bld 105 (*)    All other components within normal limits  URINALYSIS, ROUTINE W REFLEX MICROSCOPIC (NOT AT [facility]) - Abnormal; Notable for the following:    APPearance CLOUDY (*)    Leukocytes, UA TRACE (*)    All other components within normal limits  URINE MICROSCOPIC-ADD ON - Abnormal; Notable for the following:    Squamous Epithelial / LPF FEW (*)    All other components within normal limits  LIPASE, BLOOD  I-STAT BETA HCG BLOOD, ED ([facility], [facility], [facility] ONLY)  I-STAT TROPOININ, ED    Imaging Review Ct Head Wo Contrast  11/01/2015  CLINICAL DATA:  Dizziness beginning 2 weeks ago. Vomiting diarrhea and chills beginning last night. Ringing in her ears. EXAM: CT HEAD WITHOUT CONTRAST TECHNIQUE: Contiguous axial images were obtained from the base of the skull through the vertex without intravenous contrast. COMPARISON:  None. FINDINGS: No acute cortical infarct, hemorrhage, or mass lesion is present. The ventricles are of normal size. No significant extra-axial fluid collection is evident. The paranasal sinuses and mastoid air cells are clear. The calvarium is intact. The globes and orbits are intact. No significant extra-axial fluid collection is present. IMPRESSION: Negative CT of the head. Electronically Signed   By: Marin Roberts M.D.   On: 11/01/2015 10:07   I have personally reviewed and evaluated these images and lab results as part of my medical decision-making.   EKG Interpretation   Date/Time:  Thursday November 01 2015 09:27:53 EST Ventricular Rate:  96 PR Interval:  131 QRS Duration: 99 QT Interval:  363 QTC Calculation: 459 R Axis:   83 Text Interpretation:   Sinus rhythm RSR' in V1 or V2, right VCD or RVH No  previous ECGs available Confirmed by Reita Shindler  MD, Jenesis Suchy ([id number]) on 11/01/2015  10:00:49 AM      MDM   Final diagnoses:  None   Victoria Nelson is a 29 y.o. female here with dizziness, vomiting, myalgias. Likely had BPPV 2 weeks ago vs labyrinthitis exacerbated by viral syndrome. Has low grade temp here, slightly tachy. No meningeal signs. Will get labs, CT head to r/o mass, UA. Will give migraine cocktail and reassess.    12:15 PM Patient not orthostatic. WBC nl. UA nl. CT head nl. Likely viral vs BPPV. Felt better with migraine cocktail, meclizine. Sleepy from meds. Will dc home with prn meclizine, outpatient f/u.       Richardean Canal, MD 11/01/15 [phone]

## 2015-11-01 NOTE — ED Notes (Signed)
Patient is complaining of dizziness that started about 2 weeks ago. Pt seen her PCP on Monday for dizziness. Prescribed nasal spray but pt has not filled the medication. Last night, she developed vomiting, diarrhea, chills, and ringing in her ears.

## 2015-11-01 NOTE — Progress Notes (Signed)
pcp is  [facility] [address] [place] [phone]

## 2016-02-06 ENCOUNTER — Encounter (HOSPITAL_COMMUNITY): Payer: Self-pay

## 2016-02-06 ENCOUNTER — Inpatient Hospital Stay (HOSPITAL_COMMUNITY): Payer: Medicaid Other

## 2016-02-06 ENCOUNTER — Inpatient Hospital Stay (HOSPITAL_COMMUNITY)
Admission: AD | Admit: 2016-02-06 | Discharge: 2016-02-06 | Disposition: A | Discharge status: Home / Self Care | Payer: Medicaid Other | Source: Ambulatory Visit | Attending: Obstetrics & Gynecology | Admitting: Obstetrics & Gynecology

## 2016-02-06 DIAGNOSIS — R1011 Right upper quadrant pain: Secondary | ICD-10-CM | POA: Insufficient documentation

## 2016-02-06 DIAGNOSIS — O26891 Other specified pregnancy related conditions, first trimester: Secondary | ICD-10-CM | POA: Diagnosis not present

## 2016-02-06 DIAGNOSIS — Z3A00 Weeks of gestation of pregnancy not specified: Secondary | ICD-10-CM | POA: Diagnosis not present

## 2016-02-06 DIAGNOSIS — Z882 Allergy status to sulfonamides status: Secondary | ICD-10-CM | POA: Diagnosis not present

## 2016-02-06 DIAGNOSIS — L089 Local infection of the skin and subcutaneous tissue, unspecified: Secondary | ICD-10-CM | POA: Insufficient documentation

## 2016-02-06 DIAGNOSIS — O9989 Other specified diseases and conditions complicating pregnancy, childbirth and the puerperium: Secondary | ICD-10-CM

## 2016-02-06 DIAGNOSIS — R109 Unspecified abdominal pain: Secondary | ICD-10-CM | POA: Diagnosis not present

## 2016-02-06 DIAGNOSIS — R197 Diarrhea, unspecified: Secondary | ICD-10-CM | POA: Diagnosis not present

## 2016-02-06 DIAGNOSIS — O26899 Other specified pregnancy related conditions, unspecified trimester: Secondary | ICD-10-CM

## 2016-02-06 DIAGNOSIS — O3680X0 Pregnancy with inconclusive fetal viability, not applicable or unspecified: Secondary | ICD-10-CM

## 2016-02-06 HISTORY — DX: Headache: R51

## 2016-02-06 HISTORY — DX: Personal history of other diseases of the musculoskeletal system and connective tissue: Z87.39

## 2016-02-06 HISTORY — DX: Headache, unspecified: R51.9

## 2016-02-06 HISTORY — DX: Rectal prolapse: K62.3

## 2016-02-06 LAB — URINALYSIS, ROUTINE W REFLEX MICROSCOPIC
BILIRUBIN URINE: NEGATIVE
GLUCOSE, UA: NEGATIVE mg/dL
KETONES UR: 15 mg/dL — AB
Leukocytes, UA: NEGATIVE
Nitrite: NEGATIVE
PROTEIN: NEGATIVE mg/dL
Specific Gravity, Urine: >1.03 — ABNORMAL HIGH (ref 1.005–1.030)
pH: 5.5 (ref 5.0–8.0)

## 2016-02-06 LAB — ABO/RH: ABO/RH(D): A POS

## 2016-02-06 LAB — WET PREP, GENITAL
Clue Cells Wet Prep HPF POC: NONE SEEN
Sperm: NONE SEEN
TRICH WET PREP: NONE SEEN
WBC WET PREP: NONE SEEN
Yeast Wet Prep HPF POC: NONE SEEN

## 2016-02-06 LAB — URINE MICROSCOPIC-ADD ON: WBC, UA: NONE SEEN WBC/hpf (ref 0–5)

## 2016-02-06 LAB — CBC
HEMATOCRIT: 40.5 % (ref 36.0–46.0)
HEMOGLOBIN: 14 g/dL (ref 12.0–15.0)
MCH: 31 pg (ref 26.0–34.0)
MCHC: 34.6 g/dL (ref 30.0–36.0)
MCV: 89.8 fL (ref 78.0–100.0)
Platelets: 239 10*3/uL (ref 150–400)
RBC: 4.51 MIL/uL (ref 3.87–5.11)
RDW: 12.4 % (ref 11.5–15.5)
WBC: 7.6 10*3/uL (ref 4.0–10.5)

## 2016-02-06 LAB — HCG, QUANTITATIVE, PREGNANCY: HCG, BETA CHAIN, QUANT, S: 1179 m[IU]/mL — AB (ref <5)

## 2016-02-06 LAB — POCT PREGNANCY, URINE: PREG TEST UR: POSITIVE — AB

## 2016-02-06 MED ORDER — PROMETHAZINE HCL 25 MG PO TABS: 12.5000 mg | ORAL_TABLET | Freq: Four times a day (QID) | ORAL | Status: AC | PRN

## 2016-02-06 NOTE — MAU Provider Note (Signed)
Chief Complaint: No chief complaint on file.   First Provider Initiated Contact with Patient 02/06/16 2049        SUBJECTIVE  HPI : Victoria Nelson is a 30 y.o. G1P1001 at Unknown by LMP who presents to maternity admissions reporting Right upper quadrant pain and diarrhea for 2 weeks. + UPT .  Has already had gallbladder removed. . She denies vaginal bleeding, vaginal itching/burning, urinary symptoms, h/a, dizziness, n/v, or fever/chills.    States she thinks she is much further along than 2 months. Feels like this upper abdominal pain is the baby. Worried about STDs.   RN Note: Positive home preg test on Friday, for the past week she has been having right upper abd pain and is worsening. Has been on an antibiotic and stopped it when she found out she was pregnant. Antibiotic was for a skin infection on her finger and has had diarrhea while taking it.           Past Medical History  Diagnosis Date  . Headache   . History of neck pain   . Rectal prolapse    Past Surgical History  Procedure Laterality Date  . Cholecystectomy     Social History   Social History  . Marital Status: Single    Spouse Name: N/A  . Number of Children: N/A  . Years of Education: N/A   Occupational History  . Not on file.   Social History Main Topics  . Smoking status: Light Tobacco Smoker  . Smokeless tobacco: Not on file  . Alcohol Use: Yes     Comment: Once every 6 months.   . Drug Use: No  . Sexual Activity: Not on file   Other Topics Concern  . Not on file   Social History Narrative   No current facility-administered medications on file prior to encounter.   Current Outpatient Prescriptions on File Prior to Encounter  Medication Sig Dispense Refill  . Phenyleph-Doxylamine-DM-APAP (NYQUIL SEVERE COLD/FLU) 5-6.25-10-325 MG/15ML LIQD Take 60 mLs by mouth at bedtime as needed (for sleep/cold).     . rizatriptan (MAXALT-MLT) 10 MG disintegrating tablet Take 1 tablet by mouth every 2  (two) hours as needed for migraine (may repeat in 2 hours if needed).     . cyclobenzaprine (FLEXERIL) 10 MG tablet Take 1 tablet (10 mg total) by mouth 2 (two) times daily as needed for muscle spasms. (Patient not taking: Reported on 11/01/2015) 20 tablet 0  . ibuprofen (ADVIL,MOTRIN) 800 MG tablet Take 1 tablet (800 mg total) by mouth 3 (three) times daily. (Patient not taking: Reported on 11/01/2015) 21 tablet 0  . meclizine (ANTIVERT) 25 MG tablet Take 1 tablet (25 mg total) by mouth 3 (three) times daily as needed for dizziness. (Patient not taking: Reported on 02/06/2016) 20 tablet 0  . valACYclovir (VALTREX) 1000 MG tablet Take 1 tablet by mouth daily as needed (for cold sores).      Allergies  Allergen Reactions  . Sulfa Antibiotics Itching    Has patient had a PCN reaction causing immediate rash, facial/tongue/throat swelling, SOB or lightheadedness with hypotension: No Has patient had a PCN reaction causing severe rash involving mucus membranes or skin necrosis: No Has patient had a PCN reaction that required hospitalization No Has patient had a PCN reaction occurring within the last 10 years: No If all of the above answers are "NO", then may proceed with Cephalosporin use.     I have reviewed patient's Past Medical Hx, Surgical Hx, Family  Hx, Social Hx, medications and allergies.   ROS:  Review of Systems Review of Systems  Constitutional: Negative for fever and chills.  Gastrointestinal: Negative for nausea, vomiting, and constipation. Positive for RUQ pain and diarrhea  Genitourinary: Negative for dysuria.  Musculoskeletal: Negative for back pain.  Neurological: Negative for dizziness and weakness.    Physical Exam  Patient Vitals for the past 24 hrs:  BP Temp Temp src Pulse Resp SpO2 Height Weight  02/06/16 2007 132/87 mmHg - - 78 - - - -  02/06/16 1947 137/100 mmHg 98.9 F (37.2 C) Oral 97 18 100 % 5\' 3"  (1.6 m) 221 lb (100.245 kg)     Physical Exam   Constitutional: Well-developed, well-nourished female in no acute distress.  Cardiovascular: normal rate Respiratory: normal effort GI: Abd soft, non-tender. Pos BS x 4  Tender mildly on RUQ MS: Extremities nontender, no edema, normal ROM Neurologic: Alert and oriented x 4.  GU: Neg CVAT.  PELVIC EXAM: Cervix pink, visually closed, without lesion, scant white creamy discharge, vaginal walls and external genitalia normal Bimanual exam: Cervix 0/long/high, firm, anterior, neg CMT, uterus nontender, nonenlarged, adnexa without tenderness, enlargement, or mass  Bedside US shows no obvious pregnancy. Possible SIUP but picture not clear   LAB RESULTS Results for orders placed or performed during the hospital encounter of 02/06/16 (from the past 24 hour(s))  Urinalysis, Routine w reflex microscopic (not at [facility])     Status: Abnormal   Collection Time: 02/06/16  7:48 PM  Result Value Ref Range   Color, Urine YELLOW YELLOW   APPearance CLEAR CLEAR   Specific Gravity, Urine >1.030 (H) 1.005 - 1.030   pH 5.5 5.0 - 8.0   Glucose, UA NEGATIVE NEGATIVE mg/dL   Hgb urine dipstick TRACE (A) NEGATIVE   Bilirubin Urine NEGATIVE NEGATIVE   Ketones, ur 15 (A) NEGATIVE mg/dL   Protein, ur NEGATIVE NEGATIVE mg/dL   Nitrite NEGATIVE NEGATIVE   Leukocytes, UA NEGATIVE NEGATIVE  Urine microscopic-add on     Status: Abnormal   Collection Time: 02/06/16  7:48 PM  Result Value Ref Range   Squamous Epithelial / LPF 0-5 (A) NONE SEEN   WBC, UA NONE SEEN 0 - 5 WBC/hpf   RBC / HPF 0-5 0 - 5 RBC/hpf   Bacteria, UA FEW (A) NONE SEEN  Pregnancy, urine POC     Status: Abnormal   Collection Time: 02/06/16  7:56 PM  Result Value Ref Range   Preg Test, Ur POSITIVE (A) NEGATIVE  Wet prep, genital     Status: None   Collection Time: 02/06/16  8:47 PM  Result Value Ref Range   Yeast Wet Prep HPF POC NONE SEEN NONE SEEN   Trich, Wet Prep NONE SEEN NONE SEEN   Clue Cells Wet Prep HPF POC NONE SEEN NONE SEEN    WBC, Wet Prep HPF POC NONE SEEN NONE SEEN   Sperm NONE SEEN   CBC     Status: None   Collection Time: 02/06/16  8:55 PM  Result Value Ref Range   WBC 7.6 4.0 - 10.5 K/uL   RBC 4.51 3.87 - 5.11 MIL/uL   Hemoglobin 14.0 12.0 - 15.0 g/dL   HCT 16.1 09.6 - 04.5 %   MCV 89.8 78.0 - 100.0 fL   MCH 31.0 26.0 - 34.0 pg   MCHC 34.6 30.0 - 36.0 g/dL   RDW 40.9 81.1 - 91.4 %   Platelets 239 150 - 400 K/uL  ABO/Rh  Status: None (Preliminary result)   Collection Time: 02/06/16  8:55 PM  Result Value Ref Range   ABO/RH(D) A POS   hCG, quantitative, pregnancy     Status: Abnormal   Collection Time: 02/06/16  8:55 PM  Result Value Ref Range   hCG, Beta Chain, Quant, S 1179 (H) <5 mIU/mL       IMAGING US Ob Comp Less 14 Wks  02/06/2016  CLINICAL DATA:  30 year old female with right abdominal pain x1 week. EXAM: OBSTETRIC <14 WK [place] AND TRANSVAGINAL OB US TECHNIQUE: Both transabdominal and transvaginal ultrasound examinations were performed for complete evaluation of the gestation as well as the maternal uterus, adnexal regions, and pelvic cul-de-sac. Transvaginal technique was performed to assess early pregnancy. COMPARISON:  Pelvic ultrasound dated 09/07/2014 FINDINGS: The uterus is anteverted. A small cystic structure with apparent surrounding decidual reaction noted in the uterus. No definite yolk sac or fetal pole identified at this time. A faint curvilinear density within this structure may be artifactual or represent an early yolk sac. If this cystic structure is a true gestational sac the estimated gestational age based on mean sac diameter of 3 mm is 5 weeks, 0 days and estimated date of delivery 10/08/2016. Please note in the absence of yolk sac or fetal pole this cystic structure can also represent a blighted ovum or pseudo gestation of an ectopic pregnancy. Correlation with clinical exam and follow-up with serial HCG levels and ultrasound recommended. No subchorionic hemorrhage noted. The  ovaries appear unremarkable. There is a 1.6 x 1.5 x 1.6 cm corpus luteum in the right ovary. No free fluid within the pelvis. IMPRESSION: Cystic structure within the endometrium, likely an early gestational sac. Correlation with clinical exam and follow-up with serial HCG levels and ultrasound recommended. Electronically Signed   By: Elgie Collard M.D.   On: 02/06/2016 21:38   US Ob Transvaginal  02/06/2016  CLINICAL DATA:  30 year old female with right abdominal pain x1 week. EXAM: OBSTETRIC <14 WK [place] AND TRANSVAGINAL OB US TECHNIQUE: Both transabdominal and transvaginal ultrasound examinations were performed for complete evaluation of the gestation as well as the maternal uterus, adnexal regions, and pelvic cul-de-sac. Transvaginal technique was performed to assess early pregnancy. COMPARISON:  Pelvic ultrasound dated 09/07/2014 FINDINGS: The uterus is anteverted. A small cystic structure with apparent surrounding decidual reaction noted in the uterus. No definite yolk sac or fetal pole identified at this time. A faint curvilinear density within this structure may be artifactual or represent an early yolk sac. If this cystic structure is a true gestational sac the estimated gestational age based on mean sac diameter of 3 mm is 5 weeks, 0 days and estimated date of delivery 10/08/2016. Please note in the absence of yolk sac or fetal pole this cystic structure can also represent a blighted ovum or pseudo gestation of an ectopic pregnancy. Correlation with clinical exam and follow-up with serial HCG levels and ultrasound recommended. No subchorionic hemorrhage noted. The ovaries appear unremarkable. There is a 1.6 x 1.5 x 1.6 cm corpus luteum in the right ovary. No free fluid within the pelvis. IMPRESSION: Cystic structure within the endometrium, likely an early gestational sac. Correlation with clinical exam and follow-up with serial HCG levels and ultrasound recommended. Electronically Signed   By: Elgie Collard M.D.   On: 02/06/2016 21:38    MAU Management/MDM: Ordered labs and reviewed results.   Quant and other labs drawn STD cultures done   This pain could represent a normal pregnancy  with pain, spontaneous abortion or even an ectopic which can be life-threatening.   Cultures were done to rule out pelvic infection Blood drawn for Quant HCG, CBC, ABO/Rh  Pt stable at time of discharge.  ASSESSMENT 1. Abdominal pain in pregnancy     PLAN      DC home Comfort measures reviewed  1st Trimester precautions  Bleeding precautions Ectopic precautions RX: phenergan PRN #30    Follow-up Information    Follow up with [facility] [place] [facility]ERN[facility] AD[facility].   Why:  Friday night or Saturday morning for repeat blood work    Contact information:   [address] [id number] [place] [address] [phone]       Encouraged to return here or to other Urgent Care/ED if she develops worsening of symptoms, increase in pain, fever, or other concerning symptoms.   Leitha Bleak CNM, MSN Certified Nurse-Midwife 02/06/2016  8:49 PM

## 2016-02-06 NOTE — Discharge Instructions (Signed)

## 2016-02-06 NOTE — MAU Note (Signed)
Positive home preg test on Friday, for the past week she has been having right upper abd pain and is worsening. Has been on an antibiotic and stopped it when she found out she was pregnant. Antibiotic was for a skin infection on her finger and has had diarrhea while taking it.

## 2016-02-07 LAB — GC/CHLAMYDIA PROBE AMP (~~LOC~~) NOT AT ARMC
Chlamydia: NEGATIVE
Neisseria Gonorrhea: NEGATIVE

## 2016-02-08 LAB — HIV ANTIBODY (ROUTINE TESTING W REFLEX): HIV SCREEN 4TH GENERATION: NONREACTIVE

## 2016-02-08 LAB — RPR: RPR: NONREACTIVE

## 2016-02-09 ENCOUNTER — Encounter (HOSPITAL_COMMUNITY): Payer: Self-pay | Admitting: *Deleted

## 2016-02-09 ENCOUNTER — Inpatient Hospital Stay (HOSPITAL_COMMUNITY)
Admission: AD | Admit: 2016-02-09 | Discharge: 2016-02-09 | Disposition: A | Discharge status: Home / Self Care | Payer: Medicaid Other | Source: Ambulatory Visit | Attending: Obstetrics and Gynecology | Admitting: Obstetrics and Gynecology

## 2016-02-09 DIAGNOSIS — R630 Anorexia: Secondary | ICD-10-CM | POA: Diagnosis not present

## 2016-02-09 DIAGNOSIS — O3680X0 Pregnancy with inconclusive fetal viability, not applicable or unspecified: Secondary | ICD-10-CM

## 2016-02-09 DIAGNOSIS — R1011 Right upper quadrant pain: Secondary | ICD-10-CM | POA: Insufficient documentation

## 2016-02-09 DIAGNOSIS — Z3A01 Less than 8 weeks gestation of pregnancy: Secondary | ICD-10-CM | POA: Diagnosis not present

## 2016-02-09 DIAGNOSIS — O99330 Smoking (tobacco) complicating pregnancy, unspecified trimester: Secondary | ICD-10-CM | POA: Insufficient documentation

## 2016-02-09 DIAGNOSIS — Z882 Allergy status to sulfonamides status: Secondary | ICD-10-CM | POA: Diagnosis not present

## 2016-02-09 DIAGNOSIS — Z833 Family history of diabetes mellitus: Secondary | ICD-10-CM | POA: Diagnosis not present

## 2016-02-09 DIAGNOSIS — O9989 Other specified diseases and conditions complicating pregnancy, childbirth and the puerperium: Secondary | ICD-10-CM

## 2016-02-09 DIAGNOSIS — F17200 Nicotine dependence, unspecified, uncomplicated: Secondary | ICD-10-CM | POA: Diagnosis not present

## 2016-02-09 LAB — COMPREHENSIVE METABOLIC PANEL
ALT: 25 U/L (ref 14–54)
AST: 26 U/L (ref 15–41)
Albumin: 4.2 g/dL (ref 3.5–5.0)
Alkaline Phosphatase: 45 U/L (ref 38–126)
Anion gap: 7 (ref 5–15)
BUN: 10 mg/dL (ref 6–20)
CHLORIDE: 105 mmol/L (ref 101–111)
CO2: 26 mmol/L (ref 22–32)
Calcium: 8.8 mg/dL — ABNORMAL LOW (ref 8.9–10.3)
Creatinine, Ser: 0.69 mg/dL (ref 0.44–1.00)
Glucose, Bld: 117 mg/dL — ABNORMAL HIGH (ref 65–99)
POTASSIUM: 3.5 mmol/L (ref 3.5–5.1)
Sodium: 138 mmol/L (ref 135–145)
Total Bilirubin: 0.8 mg/dL (ref 0.3–1.2)
Total Protein: 6.9 g/dL (ref 6.5–8.1)

## 2016-02-09 LAB — URINE MICROSCOPIC-ADD ON

## 2016-02-09 LAB — URINALYSIS, ROUTINE W REFLEX MICROSCOPIC
Glucose, UA: NEGATIVE mg/dL
Ketones, ur: 15 mg/dL — AB
Leukocytes, UA: NEGATIVE
NITRITE: NEGATIVE
PROTEIN: NEGATIVE mg/dL
Specific Gravity, Urine: 1.025 (ref 1.005–1.030)
pH: 6 (ref 5.0–8.0)

## 2016-02-09 LAB — HCG, QUANTITATIVE, PREGNANCY: HCG, BETA CHAIN, QUANT, S: 3622 m[IU]/mL — AB (ref <5)

## 2016-02-09 NOTE — MAU Provider Note (Signed)
History     CSN: [id number]  Arrival date and time: 02/09/16 2725   First Provider Initiated Contact with Patient 02/09/16 [phone]      Chief Complaint  Patient presents with  . Abdominal Pain   HPI Victoria Nelson 30 y.o. [redacted]w[redacted]d Comes to MAU today as a follow up from visit on 02-06-16 - chart reviewed.  Coming for repeat quant although client continues to have RUQ pain stating she feels pressure there like late in a pregnancy.  Ate chicken nuggets yesterday and has not eaten yet today.  Denies any nausea or vomiting.  Is concerned about medications she can take in the pregnancy.  Became tearful as she reports she has smoked during this pregnancy and has doubts about whether to terminate the pregnancy.  Was not using contraception but reports she was not trying to become pregnant.  Has an appointment to begin prenatal care with private office on 02-19-16.  Is taking Valtrex as she feels like she is getting an outbreak of HSV.  She states she has been stressed and often has an outbreak of HSV when she is stressed.  Asked if it is OK to take her medication for HSV during pregnancy.  Admits she has GI problems and has alternating diarrhea and constipation.  Asked if it is OK to take a stool softener or fiber supplement during pregnancy.   OB History    Gravida Para Term Preterm AB TAB SAB Ectopic Multiple Living   Past Medical History  Diagnosis Date  . Headache   . History of neck pain   . Rectal prolapse     Past Surgical History  Procedure Laterality Date  . Cholecystectomy      Family History  Problem Relation Age of Onset  . Diabetes Father     Social History  Substance Use Topics  . Smoking status: Light Tobacco Smoker  . Smokeless tobacco: None  . Alcohol Use: Yes     Comment: Once every 6 months.     Allergies:  Allergies  Allergen Reactions  . Sulfa Antibiotics Itching    Has patient had a PCN reaction causing immediate rash, facial/tongue/throat  swelling, SOB or lightheadedness with hypotension: No Has patient had a PCN reaction causing severe rash involving mucus membranes or skin necrosis: No Has patient had a PCN reaction that required hospitalization No Has patient had a PCN reaction occurring within the last 10 years: No If all of the above answers are "NO", then may proceed with Cephalosporin use.     Prescriptions prior to admission  Medication Sig Dispense Refill Last Dose  . valACYclovir (VALTREX) 1000 MG tablet Take 1 tablet by mouth daily as needed (for cold sores).    02/08/2016 at Unknown time  . acetaminophen (TYLENOL) 500 MG tablet Take 1,000 mg by mouth every 6 (six) hours as needed for moderate pain.    prn  . cyclobenzaprine (FLEXERIL) 10 MG tablet Take 1 tablet (10 mg total) by mouth 2 (two) times daily as needed for muscle spasms. (Patient not taking: Reported on 11/01/2015) 20 tablet 0 Not Taking at Unknown time  . meclizine (ANTIVERT) 25 MG tablet Take 1 tablet (25 mg total) by mouth 3 (three) times daily as needed for dizziness. (Patient not taking: Reported on 02/06/2016) 20 tablet 0 Not Taking at Unknown time  . promethazine (PHENERGAN) 25 MG tablet Take 0.5-1 tablets (12.5-25 mg total) by  mouth every 6 (six) hours as needed. (Patient not taking: Reported on 02/09/2016) 30 tablet 0 Not Taking at Unknown time    Review of Systems  Constitutional: Negative for fever.  Gastrointestinal: Positive for abdominal pain, diarrhea and constipation. Negative for nausea and vomiting.  Genitourinary:       No vaginal discharge. No vaginal bleeding. No dysuria.   Physical Exam   Blood pressure 119/76, pulse 86, temperature 98.6 F (37 C), temperature source Oral, resp. rate 16, last menstrual period 01/04/2016.  Physical Exam  Nursing note and vitals reviewed. Constitutional: She is oriented to person, place, and time. She appears well-developed and well-nourished.  HENT:  Head: Normocephalic.  Eyes: EOM are  normal.  Neck: Neck supple.  GI: Soft. There is tenderness.  Mild tenderness in upper right quadrant  Musculoskeletal: Normal range of motion.  Neurological: She is alert and oriented to person, place, and time.  Skin: Skin is warm and dry.  Psychiatric: She has a normal mood and affect.    MAU Course  Procedures Results for orders placed or performed during the hospital encounter of 02/09/16 (from the past 24 hour(s))  Urinalysis, Routine w reflex microscopic (not at [facility])     Status: Abnormal   Collection Time: 02/09/16  9:20 AM  Result Value Ref Range   Color, Urine YELLOW YELLOW   APPearance CLEAR CLEAR   Specific Gravity, Urine 1.025 1.005 - 1.030   pH 6.0 5.0 - 8.0   Glucose, UA NEGATIVE NEGATIVE mg/dL   Hgb urine dipstick TRACE (A) NEGATIVE   Bilirubin Urine SMALL (A) NEGATIVE   Ketones, ur 15 (A) NEGATIVE mg/dL   Protein, ur NEGATIVE NEGATIVE mg/dL   Nitrite NEGATIVE NEGATIVE   Leukocytes, UA NEGATIVE NEGATIVE  Urine microscopic-add on     Status: Abnormal   Collection Time: 02/09/16  9:20 AM  Result Value Ref Range   Squamous Epithelial / LPF 6-30 (A) NONE SEEN   WBC, UA 0-5 0 - 5 WBC/hpf   RBC / HPF 0-5 0 - 5 RBC/hpf   Bacteria, UA RARE (A) NONE SEEN   Urine-Other MUCOUS PRESENT   hCG, quantitative, pregnancy     Status: Abnormal   Collection Time: 02/09/16 10:11 AM  Result Value Ref Range   hCG, Beta Chain, Quant, S 3622 (H) <5 mIU/mL  Comprehensive metabolic panel     Status: Abnormal   Collection Time: 02/09/16 10:11 AM  Result Value Ref Range   Sodium 138 135 - 145 mmol/L   Potassium 3.5 3.5 - 5.1 mmol/L   Chloride 105 101 - 111 mmol/L   CO2 26 22 - 32 mmol/L   Glucose, Bld 117 (H) 65 - 99 mg/dL   BUN 10 6 - 20 mg/dL   Creatinine, Ser 1.61 0.44 - 1.00 mg/dL   Calcium 8.8 (L) 8.9 - 10.3 mg/dL   Total Protein 6.9 6.5 - 8.1 g/dL   Albumin 4.2 3.5 - 5.0 g/dL   AST 26 15 - 41 U/L   ALT 25 14 - 54 U/L   Alkaline Phosphatase 45 38 - 126 U/L   Total  Bilirubin 0.8 0.3 - 1.2 mg/dL   GFR calc non Af Amer >60 >60 mL/min   GFR calc Af Amer >60 >60 mL/min   Anion gap 7 5 - 15    MDM Reassured client that she has normal concerns given that this pregnancy was a surprise for her.  Encouraged her to consider her options and either start prenatal care  or seek termination soon if she desires that option.  Advised that she can stop smoking and there are medications that are safe in pregnancy so she can do what she can to support a healthy pregnancy.  Advised client her labs are normal and there is no evidence of a problem at this time.  Encouraged to avoid oils and fried foods and to eat small frequent meals and snacks.  Will schedule for outpatient ultrasound and encouraged her to keep has appointment as scheduled on 02-19-16 to begin prenatal care.  Assessment and Plan  Appropriately rising quants Upper right abdominal pain Decreased appetite  Plan Will schedule outpatient followup ultrasound - expect ultrasound to call to schedule Keep your appointments as scheduled. No smoking, no drugs, no alcohol.  Take a prenatal vitamin one by mouth every day.  Eat small frequent snacks to avoid nausea.  Begin prenatal care as soon as possible.    Conrey,TERRI 02/09/2016, 10:29 AM

## 2016-02-09 NOTE — MAU Note (Signed)
Pt states the abdominal pain is the same as her last visit.  She states that sometimes it gets better throughout the day.  Pt states she is having difficulty urinating and the other day it was burning and itching but it is not doing that anymore.  Pt states she has herpes and it feel like it may be the onset of an outbreak so she went ahead and took her medication.

## 2016-02-09 NOTE — Discharge Instructions (Signed)
Keep your appointments as scheduled. No smoking, no drugs, no alcohol.  Take a prenatal vitamin one by mouth every day.  Eat small frequent snacks to avoid nausea.  Begin prenatal care as soon as possible. Expect ultrasound to call you to schedule an appointment. Return sooner if you have severe vaginal bleeding or severe abdominal pain.  Safe Medications in Pregnancy   Acne: Benzoyl Peroxide Salicylic Acid  Backache/Headache: Tylenol: 2 regular strength every 4 hours OR              2 Extra strength every 6 hours  Colds/Coughs/Allergies: Benadryl (alcohol free) 25 mg every 6 hours as needed Breath right strips Claritin Cepacol throat lozenges Chloraseptic throat spray Cold-Eeze- up to three times per day Cough drops, alcohol free Flonase (by prescription only) Guaifenesin Mucinex Robitussin DM (plain only, alcohol free) Saline nasal spray/drops Sudafed (pseudoephedrine) & Actifed ** use only after [redacted] weeks gestation and if you do not have high blood pressure Tylenol Vicks Vaporub Zinc lozenges Zyrtec   Constipation: Colace Ducolax suppositories Fleet enema Glycerin suppositories Metamucil Milk of magnesia Miralax Senokot Smooth move tea  Diarrhea: Kaopectate Imodium A-D  *NO pepto Bismol  Hemorrhoids: Anusol Anusol HC Preparation H Tucks  Indigestion: Tums Maalox Mylanta Zantac  Pepcid  Insomnia: Benadryl (alcohol free)  every 6 hours as needed Tylenol PM Unisom, no Gelcaps  Leg Cramps: Tums MagGel  Nausea/Vomiting:  Bonine Dramamine Emetrol Ginger extract Sea bands Meclizine  Nausea medication to take during pregnancy:  Unisom (doxylamine succinate 25 mg tablets) Take one tablet daily at bedtime. If symptoms are not adequately controlled, the dose can be increased to a maximum recommended dose of two tablets daily (1/2 tablet in the morning, 1/2 tablet mid-afternoon and one at bedtime). Vitamin B6  tablets. Take one tablet  twice a day (up to 200 mg per day).  Skin Rashes: Aveeno products Benadryl cream or  every 6 hours as needed Calamine Lotion 1% cortisone cream  Yeast infection: Gyne-lotrimin 7 Monistat 7   **If taking multiple medications, please check labels to avoid duplicating the same active ingredients **take medication as directed on the label ** Do not exceed 4000 mg of tylenol in 24 hours **Do not take medications that contain aspirin or ibuprofen

## 2016-02-13 ENCOUNTER — Telehealth (HOSPITAL_COMMUNITY): Payer: Self-pay

## 2016-04-15 ENCOUNTER — Inpatient Hospital Stay (HOSPITAL_COMMUNITY)
Admission: AD | Admit: 2016-04-15 | Discharge: 2016-04-15 | Disposition: A | Discharge status: Home / Self Care | Payer: Medicaid Other | Source: Ambulatory Visit | Attending: Obstetrics and Gynecology | Admitting: Obstetrics and Gynecology

## 2016-04-15 DIAGNOSIS — Z5321 Procedure and treatment not carried out due to patient leaving prior to being seen by health care provider: Secondary | ICD-10-CM | POA: Diagnosis not present

## 2016-04-15 DIAGNOSIS — R109 Unspecified abdominal pain: Secondary | ICD-10-CM | POA: Diagnosis present

## 2016-04-15 LAB — URINALYSIS, ROUTINE W REFLEX MICROSCOPIC
BILIRUBIN URINE: NEGATIVE
Glucose, UA: NEGATIVE mg/dL
HGB URINE DIPSTICK: NEGATIVE
KETONES UR: NEGATIVE mg/dL
Leukocytes, UA: NEGATIVE
NITRITE: NEGATIVE
PROTEIN: NEGATIVE mg/dL
Specific Gravity, Urine: >1.03 — ABNORMAL HIGH (ref 1.005–1.030)
pH: 5 (ref 5.0–8.0)

## 2016-04-15 NOTE — MAU Note (Signed)
Kicked in the stomach, rt upper abd. Having pain in upper abd. No bleeding.

## 2016-04-15 NOTE — MAU Note (Signed)
NOT IN LOBBY 

## 2016-04-15 NOTE — MAU Note (Signed)
Pt was not in lobby when name was called.

## 2016-04-15 NOTE — Progress Notes (Signed)
Called pt in lobby to come back into a room, pt is not in lobby.

## 2016-10-18 IMAGING — CT CT HEAD W/O CM
2 series · 16 of 30 positions shown, 20 images · non-contrast
Comparison: None.

CLINICAL DATA: Dizziness beginning 2 weeks ago. Vomiting diarrhea
and chills beginning last night. Ringing in her ears.

EXAM:
CT HEAD WITHOUT CONTRAST
TECHNIQUE: Contiguous axial images were obtained from the base of the skull
through the vertex without intravenous contrast.

[Series 2: head w/o · axial · non-contrast · 0.45mm/px · z∈[-140,-15]mm · 13 of 31 slices shown, 17 images]
[im 3/31  brain]
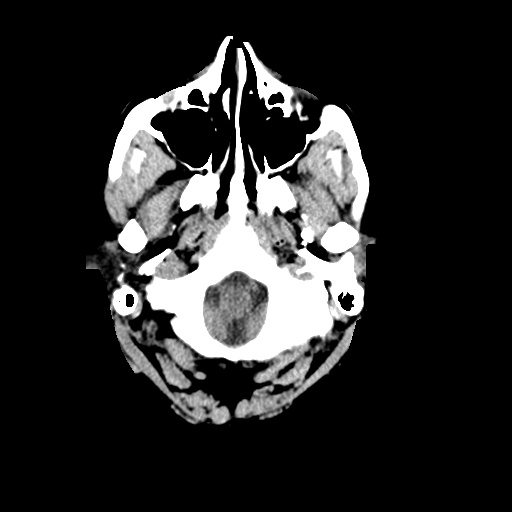
[im 3/31  bone]
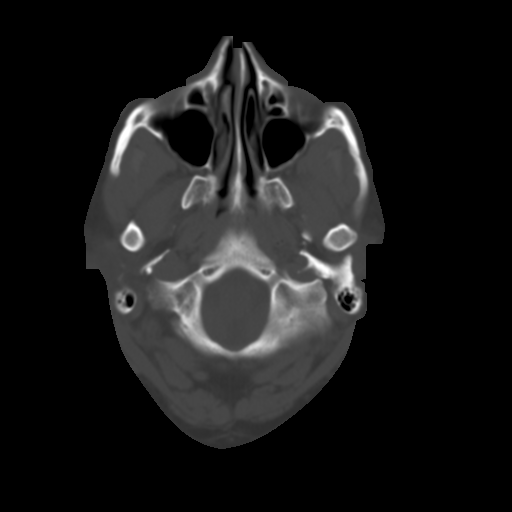
[im 5/31  brain]
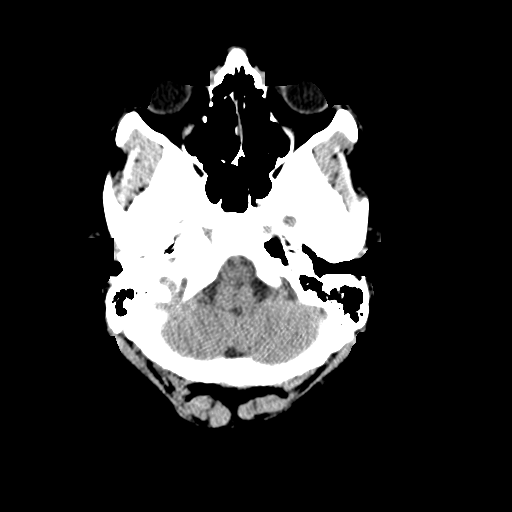
[im 7/31  brain]
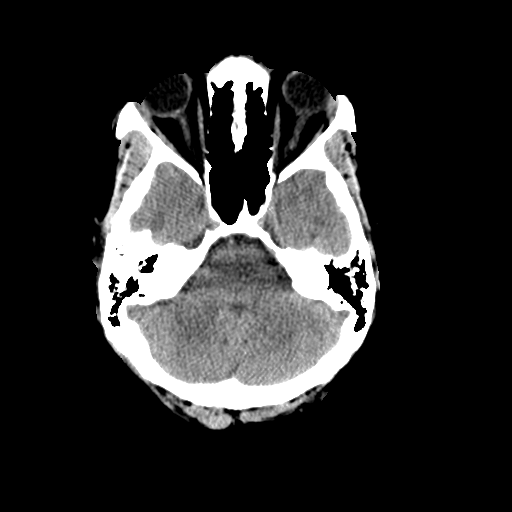
[im 9/31  brain]
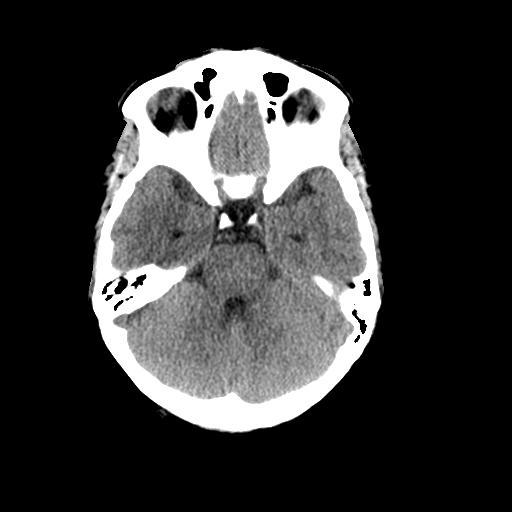
[im 11/31  brain]
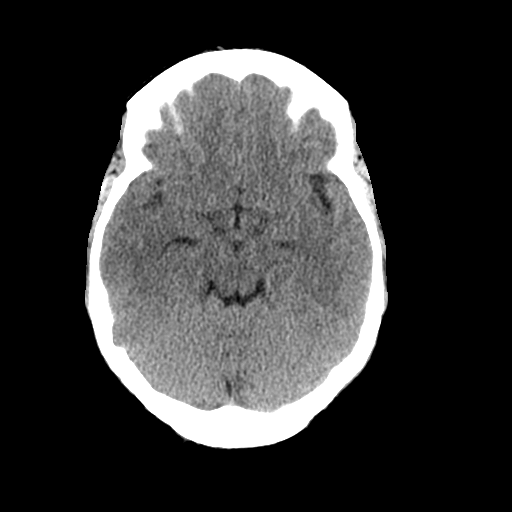
[im 11/31  bone]
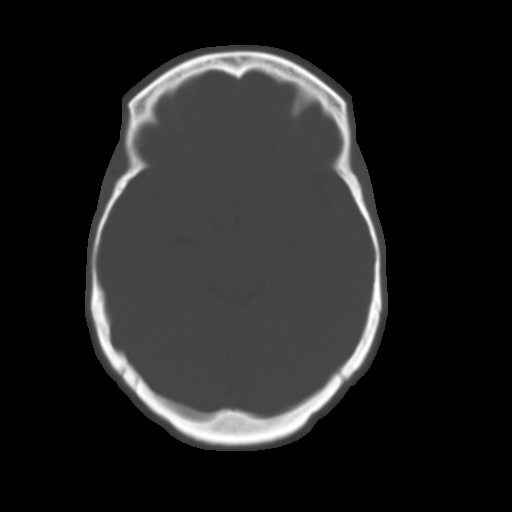
[im 13/31  brain]
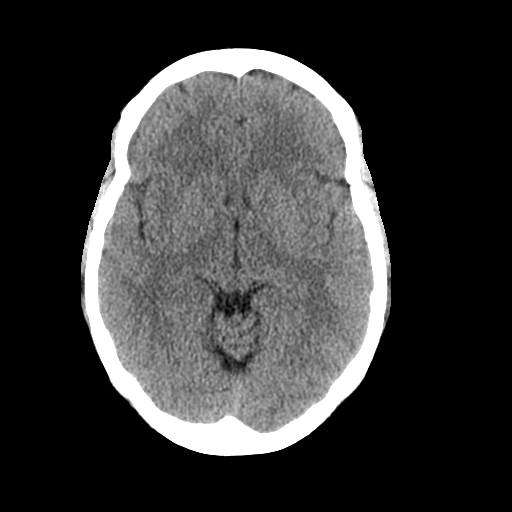
[im 16/31  brain]
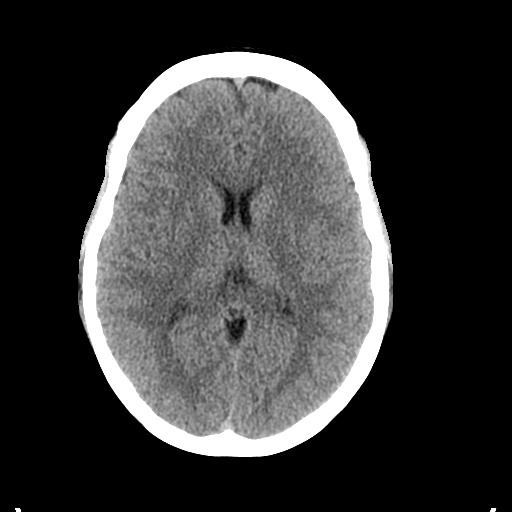
[im 18/31  brain]
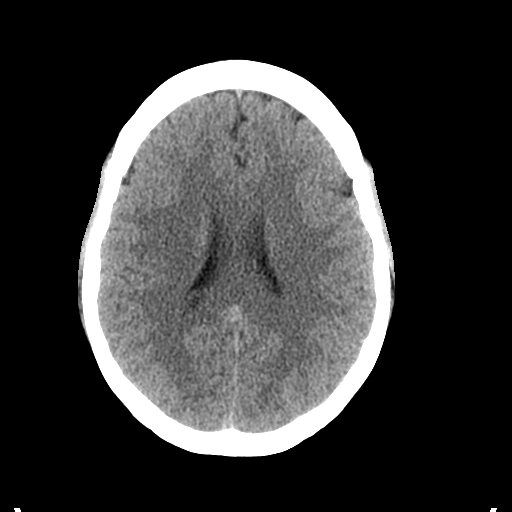
[im 20/31  brain]
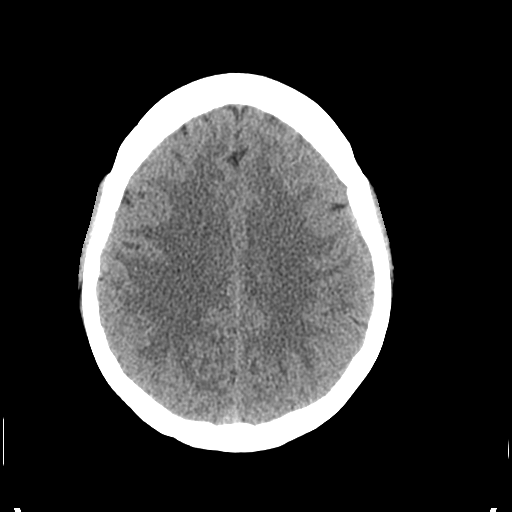
[im 20/31  bone]
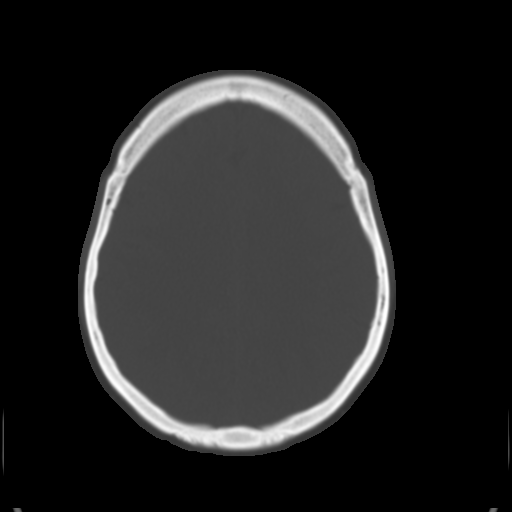
[im 22/31  brain]
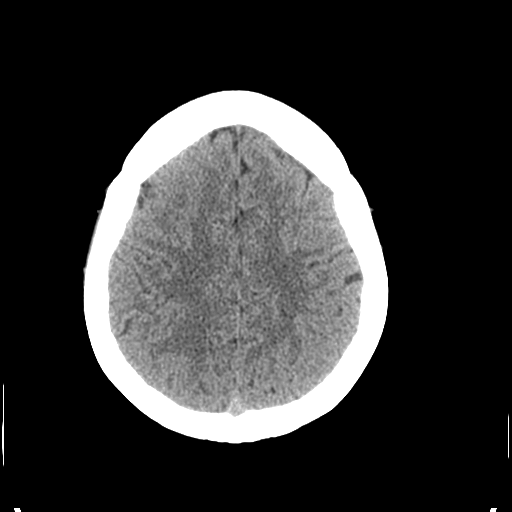
[im 24/31  brain]
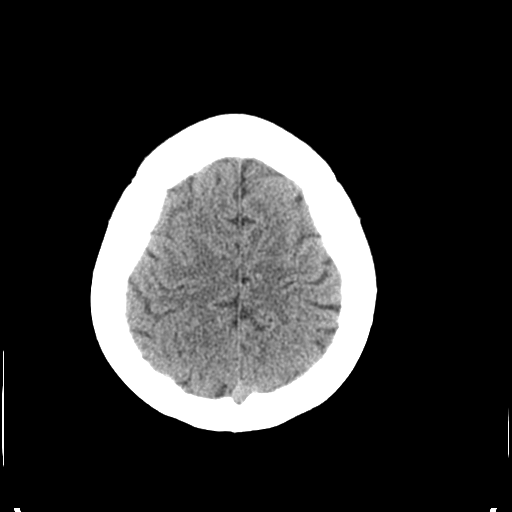
[im 26/31  brain]
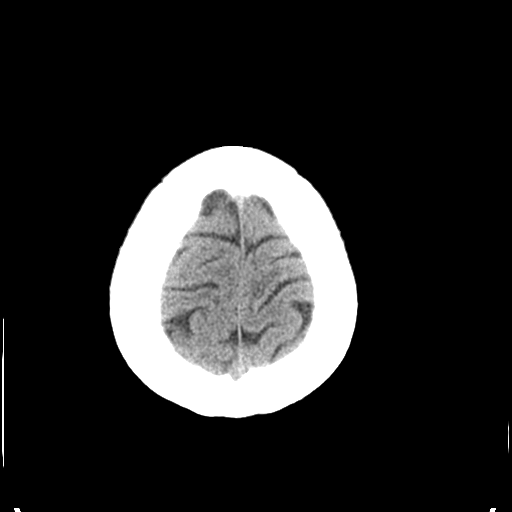
[im 28/31  brain]
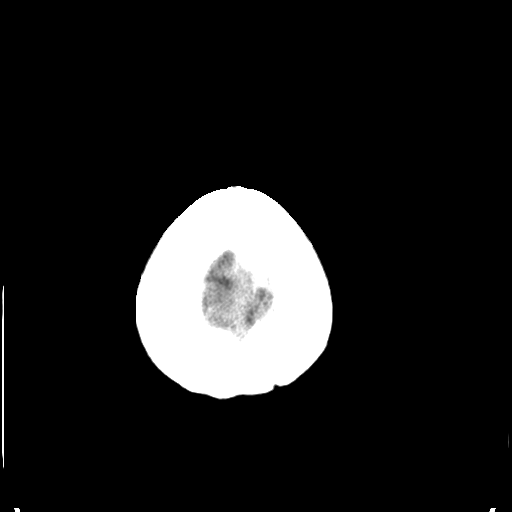
[im 28/31  bone]
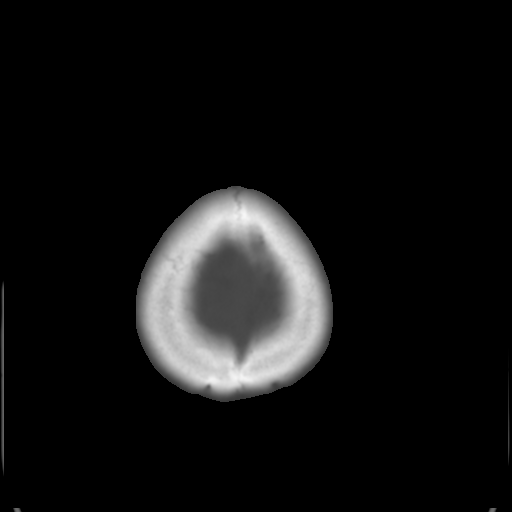

[Series 3: bone windows · axial · 0.45mm/px · z∈[-140,-100]mm · 3 of 31 slices shown]
[im 3/31  bone]
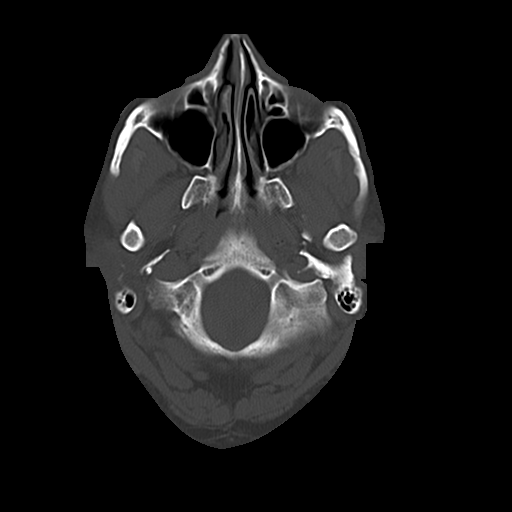
[im 7/31  bone]
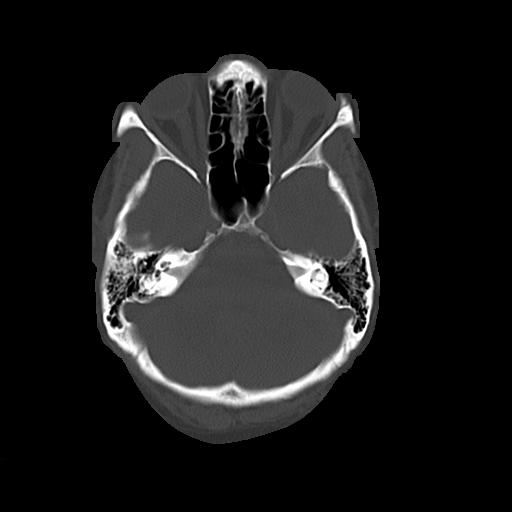
[im 11/31  bone]
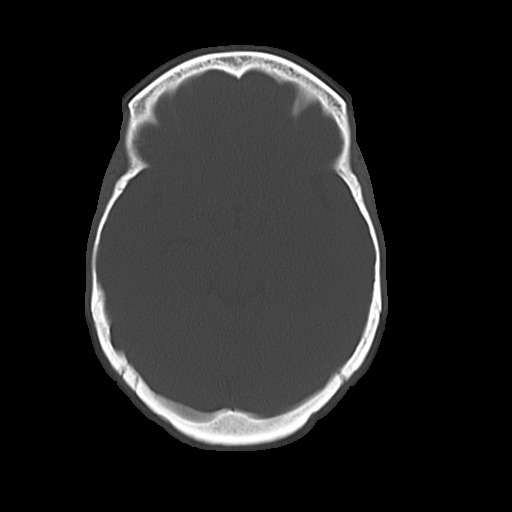

[16 of 30 positions shown; findings below may reference images not displayed]

FINDINGS: No acute cortical infarct, hemorrhage, or mass lesion is present.
The ventricles are of normal size. No significant extra-axial fluid
collection is evident. The paranasal sinuses and mastoid air cells
are clear. The calvarium is intact. The globes and orbits are
intact. No significant extra-axial fluid collection is present.
IMPRESSION: Negative CT of the head.

## 2016-12-14 ENCOUNTER — Encounter (HOSPITAL_COMMUNITY): Payer: Self-pay

## 2017-01-23 IMAGING — US US OB TRANSVAGINAL
1 series · 15 of 28 positions shown · non-contrast
Comparison: Pelvic ultrasound dated 09/07/2014

CLINICAL DATA: 29-year-old female with right abdominal pain x1
week.

EXAM:
OBSTETRIC <14 WK US AND TRANSVAGINAL OB US
TECHNIQUE: Both transabdominal and transvaginal ultrasound examinations were
performed for complete evaluation of the gestation as well as the
maternal uterus, adnexal regions, and pelvic cul-de-sac.
Transvaginal technique was performed to assess early pregnancy.

[Series 1: us ob transvaginal · 15 of 54 slices shown]
[im 1/54]
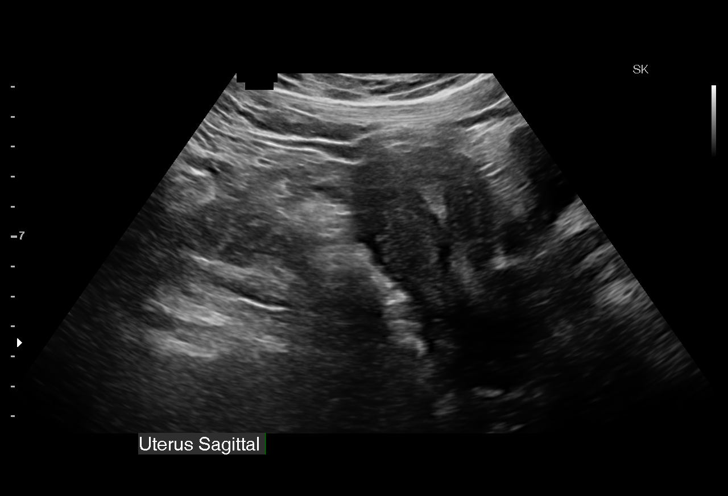
[im 4/54]
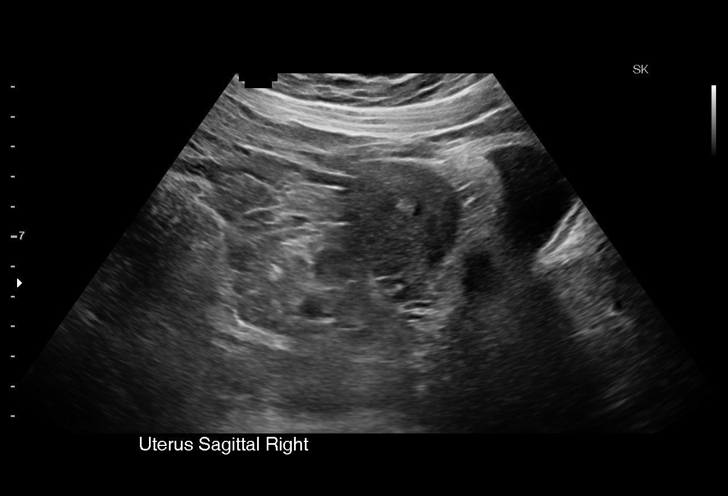
[im 8/54]
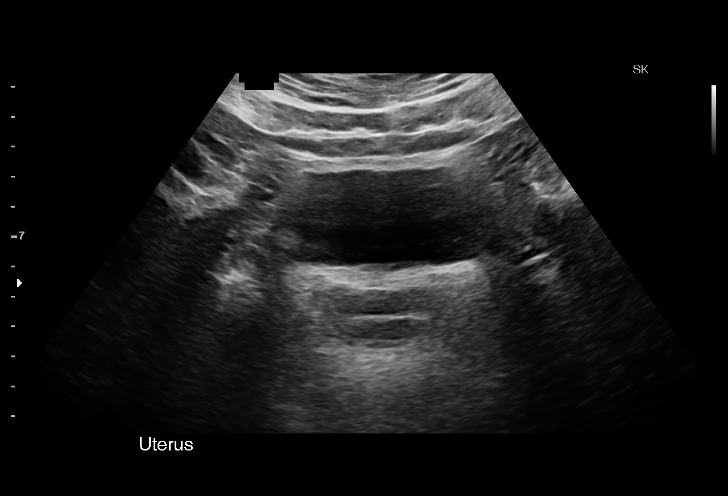
[im 12/54]
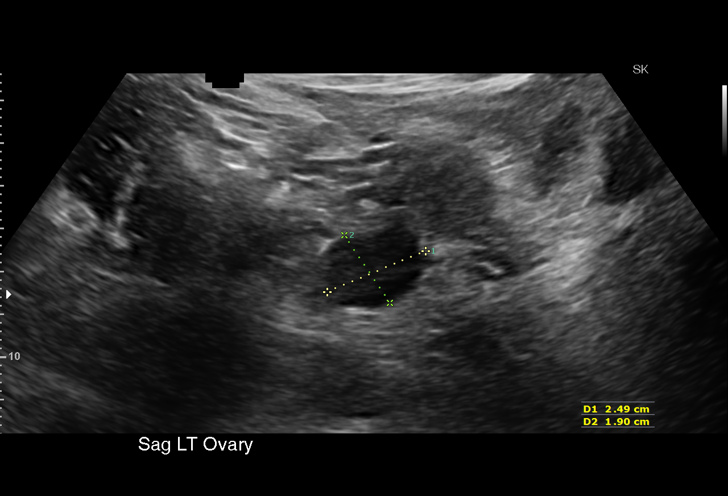
[im 16/54]
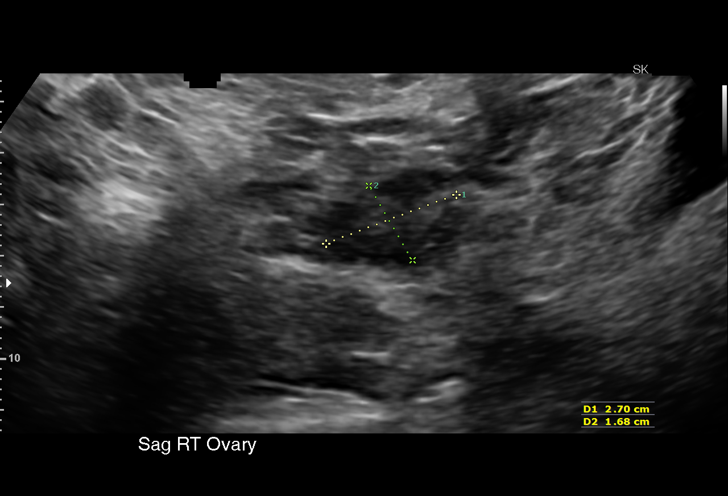
[im 20/54]
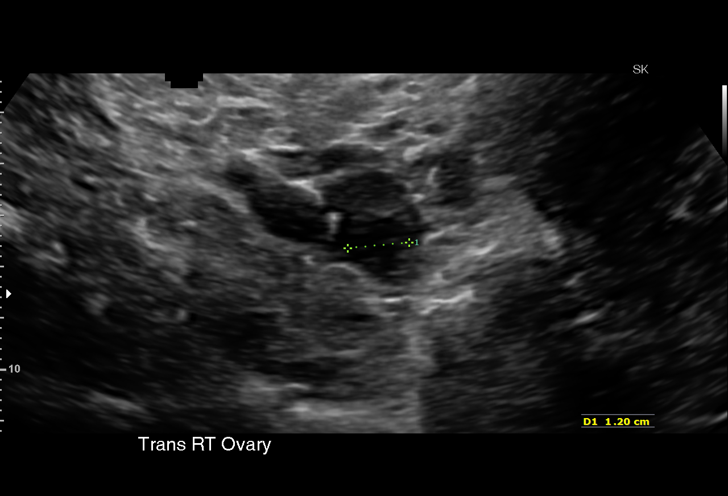
[im 24/54]
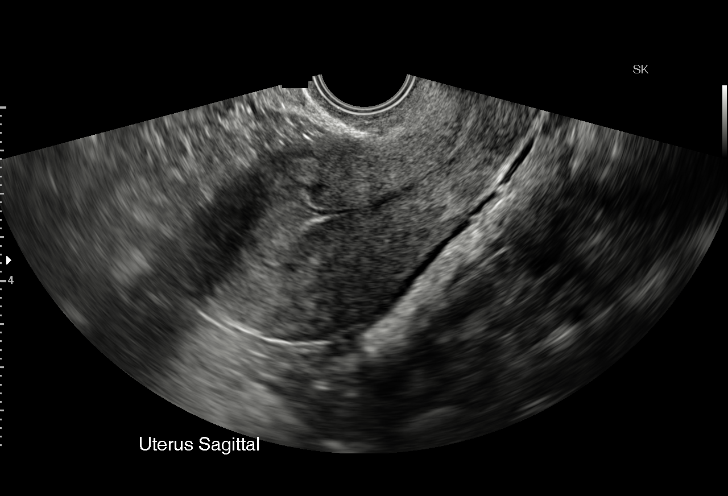
[im 28/54]
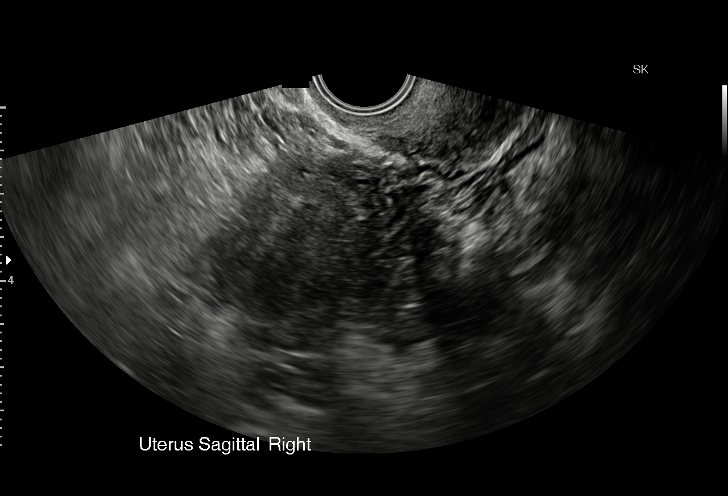
[im 30/54]
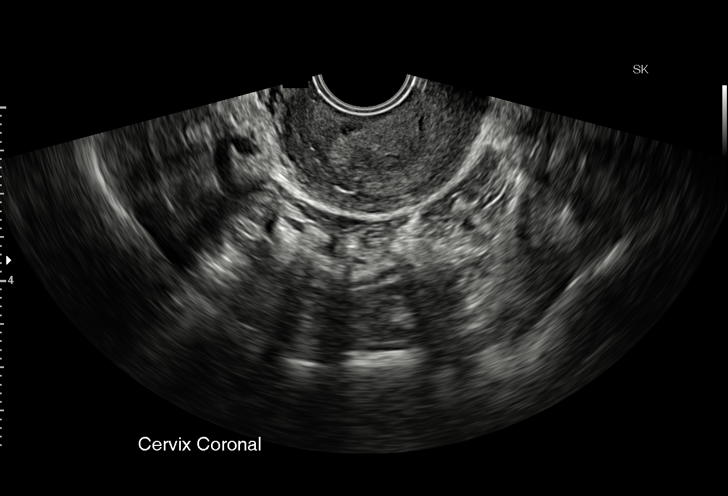
[im 34/54]
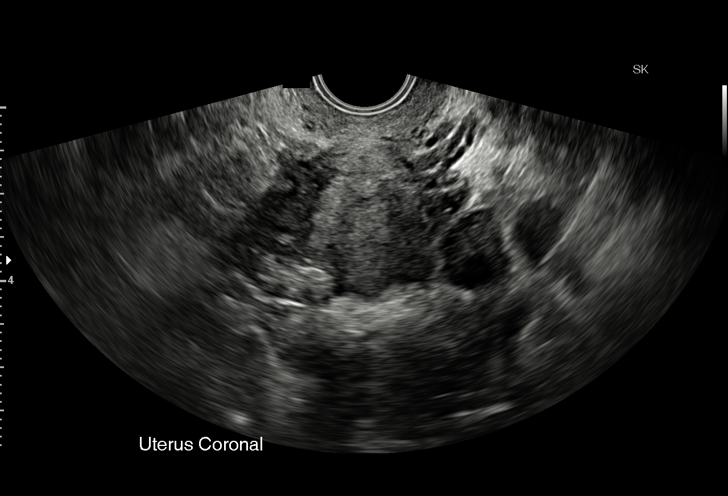
[im 38/54]
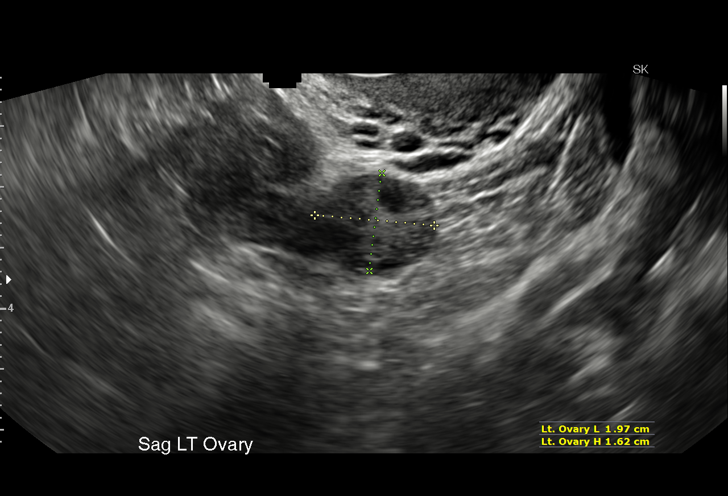
[im 42/54]
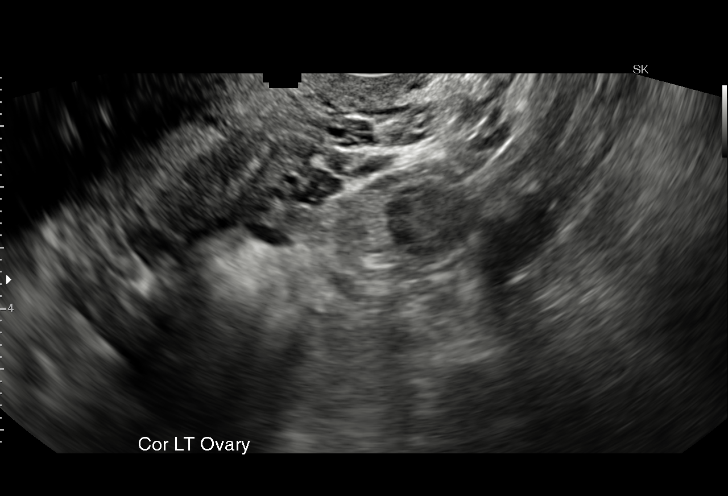
[im 46/54]
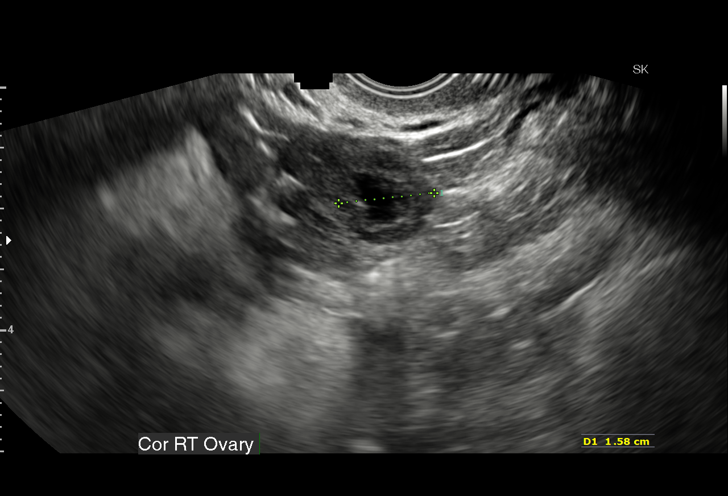
[im 50/54]
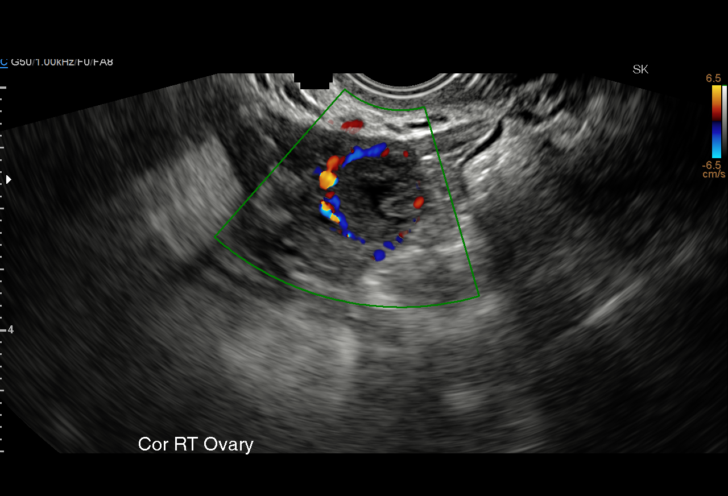
[im 54/54]
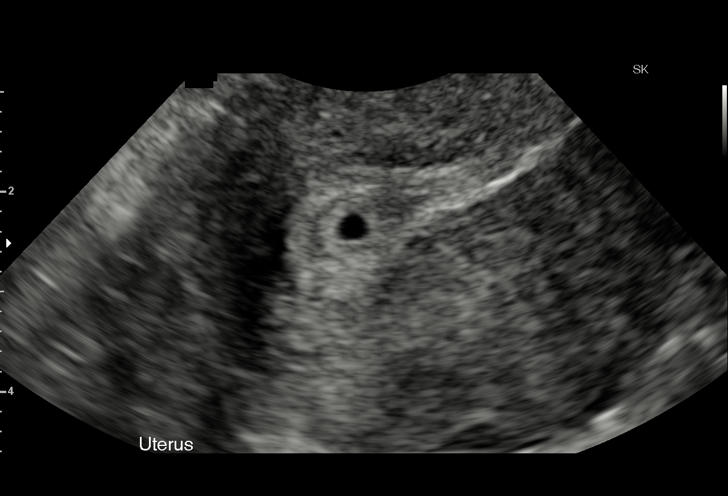

[15 of 28 positions shown; findings below may reference images not displayed]

FINDINGS: The uterus is anteverted. A small cystic structure with apparent
surrounding decidual reaction noted in the uterus. No definite yolk
sac or fetal pole identified at this time. A faint curvilinear
density within this structure may be artifactual or represent an
early yolk sac. If this cystic structure is a true gestational sac
the estimated gestational age based on mean sac diameter of 3 mm is
5 weeks, 0 days and estimated date of delivery 10/08/2016.

Please note in the absence of yolk sac or fetal pole this cystic
structure can also represent a blighted ovum or pseudo gestation of
an ectopic pregnancy. Correlation with clinical exam and follow-up
with serial HCG levels and ultrasound recommended.

No subchorionic hemorrhage noted.

The ovaries appear unremarkable. There is a 1.6 x 1.5 x 1.6 cm
corpus luteum in the right ovary.

No free fluid within the pelvis.
IMPRESSION: Cystic structure within the endometrium, likely an early gestational
sac. Correlation with clinical exam and follow-up with serial HCG
levels and ultrasound recommended.

## 2017-02-24 NOTE — Telephone Encounter (Signed)
See "Reason for call" 

## 2019-10-27 ENCOUNTER — Other Ambulatory Visit: Payer: Self-pay

## 2019-10-27 DIAGNOSIS — Z20822 Contact with and (suspected) exposure to covid-19: Secondary | ICD-10-CM

## 2019-10-29 LAB — NOVEL CORONAVIRUS, NAA: SARS-CoV-2, NAA: NOT DETECTED

## 2019-11-11 ENCOUNTER — Other Ambulatory Visit: Payer: Self-pay

## 2019-11-11 DIAGNOSIS — Z20822 Contact with and (suspected) exposure to covid-19: Secondary | ICD-10-CM

## 2019-11-14 LAB — NOVEL CORONAVIRUS, NAA: SARS-CoV-2, NAA: NOT DETECTED

## 2020-03-02 ENCOUNTER — Other Ambulatory Visit: Payer: Self-pay | Admitting: *Deleted

## 2020-03-02 DIAGNOSIS — Z20822 Contact with and (suspected) exposure to covid-19: Secondary | ICD-10-CM

## 2020-03-03 LAB — NOVEL CORONAVIRUS, NAA: SARS-CoV-2, NAA: NOT DETECTED
